# Patient Record
Sex: Male | Born: 1965 | Race: White | Hispanic: Yes | Marital: Married | Smoking: Current every day smoker
Health system: Southern US, Community
[De-identification: ages and names within clinical notes are randomized; demographics above are authoritative.]

## PROBLEM LIST (undated history)

## (undated) DIAGNOSIS — K5792 Diverticulitis of intestine, part unspecified, without perforation or abscess without bleeding: Secondary | ICD-10-CM

## (undated) DIAGNOSIS — M545 Low back pain, unspecified: Secondary | ICD-10-CM

## (undated) DIAGNOSIS — E7800 Pure hypercholesterolemia, unspecified: Secondary | ICD-10-CM

## (undated) DIAGNOSIS — I10 Essential (primary) hypertension: Secondary | ICD-10-CM

## (undated) DIAGNOSIS — G4733 Obstructive sleep apnea (adult) (pediatric): Secondary | ICD-10-CM

## (undated) DIAGNOSIS — I4891 Unspecified atrial fibrillation: Secondary | ICD-10-CM

## (undated) HISTORY — DX: Low back pain, unspecified: M54.50

## (undated) HISTORY — DX: Essential (primary) hypertension: I10

## (undated) HISTORY — PX: CARDIAC SURGERY: SHX584

## (undated) HISTORY — DX: Obstructive sleep apnea (adult) (pediatric): G47.33

## (undated) HISTORY — DX: Pure hypercholesterolemia, unspecified: E78.00

## (undated) HISTORY — DX: Diverticulitis of intestine, part unspecified, without perforation or abscess without bleeding: K57.92

## (undated) HISTORY — PX: LAPAROSCOPIC SIGMOID COLECTOMY: SHX5928

---

## 2000-12-02 ENCOUNTER — Encounter: Admission: RE | Admit: 2000-12-02 | Discharge: 2000-12-09 | Payer: Self-pay | Admitting: *Deleted

## 2003-03-31 ENCOUNTER — Encounter: Payer: Self-pay | Admitting: Family Medicine

## 2003-03-31 ENCOUNTER — Encounter: Admission: RE | Admit: 2003-03-31 | Discharge: 2003-03-31 | Payer: Self-pay | Admitting: Family Medicine

## 2006-09-29 ENCOUNTER — Emergency Department (HOSPITAL_COMMUNITY): Admission: EM | Admit: 2006-09-29 | Discharge: 2006-09-29 | Payer: Self-pay | Admitting: Emergency Medicine

## 2008-07-25 ENCOUNTER — Encounter: Admission: RE | Admit: 2008-07-25 | Discharge: 2008-07-25 | Payer: Self-pay | Admitting: Gastroenterology

## 2009-06-26 ENCOUNTER — Encounter: Admission: RE | Admit: 2009-06-26 | Discharge: 2009-06-26 | Payer: Self-pay | Admitting: Gastroenterology

## 2009-08-03 ENCOUNTER — Encounter (INDEPENDENT_AMBULATORY_CARE_PROVIDER_SITE_OTHER): Payer: Self-pay | Admitting: General Surgery

## 2009-08-03 ENCOUNTER — Inpatient Hospital Stay (HOSPITAL_COMMUNITY): Admission: RE | Admit: 2009-08-03 | Discharge: 2009-08-08 | Payer: Self-pay | Admitting: General Surgery

## 2011-01-30 LAB — BASIC METABOLIC PANEL
CO2: 28 mEq/L (ref 19–32)
CO2: 28 mEq/L (ref 19–32)
Calcium: 9.4 mg/dL (ref 8.4–10.5)
Chloride: 101 mEq/L (ref 96–112)
Chloride: 101 mEq/L (ref 96–112)
Creatinine, Ser: 0.77 mg/dL (ref 0.4–1.5)
GFR calc Af Amer: >60 mL/min (ref >60)
GFR calc Af Amer: >60 mL/min (ref >60)
Sodium: 135 mEq/L (ref 135–145)
Sodium: 137 mEq/L (ref 135–145)

## 2011-01-30 LAB — CBC
Hemoglobin: 14.9 g/dL (ref 13.0–17.0)
Hemoglobin: 16.4 g/dL (ref 13.0–17.0)
MCHC: 34.7 g/dL (ref 30.0–36.0)
MCHC: 35.2 g/dL (ref 30.0–36.0)
MCV: 92.7 fL (ref 78.0–100.0)
RBC: 4.55 MIL/uL (ref 4.22–5.81)
RBC: 5.09 MIL/uL (ref 4.22–5.81)
WBC: 17.6 10*3/uL — ABNORMAL HIGH (ref 4.0–10.5)
WBC: 8.9 10*3/uL (ref 4.0–10.5)

## 2011-01-30 LAB — CARDIAC PANEL(CRET KIN+CKTOT+MB+TROPI)
Relative Index: 0.2 (ref 0.0–2.5)
Total CK: 876 U/L — ABNORMAL HIGH (ref 7–232)

## 2011-01-31 LAB — CBC
HCT: 45.4 % (ref 39.0–52.0)
MCHC: 34.8 g/dL (ref 30.0–36.0)
MCV: 92.2 fL (ref 78.0–100.0)
RBC: 4.92 MIL/uL (ref 4.22–5.81)
WBC: 7.6 10*3/uL (ref 4.0–10.5)

## 2011-01-31 LAB — DIFFERENTIAL
Basophils Absolute: 0 10*3/uL (ref 0.0–0.1)
Eosinophils Relative: 3 % (ref 0–5)
Lymphocytes Relative: 40 % (ref 12–46)
Lymphs Abs: 3.1 10*3/uL (ref 0.7–4.0)
Neutro Abs: 3.6 10*3/uL (ref 1.7–7.7)
Neutrophils Relative %: 47 % (ref 43–77)

## 2011-01-31 LAB — COMPREHENSIVE METABOLIC PANEL
AST: 27 U/L (ref 0–37)
BUN: 16 mg/dL (ref 6–23)
CO2: 30 mEq/L (ref 19–32)
Calcium: 9.4 mg/dL (ref 8.4–10.5)
Chloride: 105 mEq/L (ref 96–112)
Creatinine, Ser: 0.94 mg/dL (ref 0.4–1.5)
GFR calc Af Amer: >60 mL/min (ref >60)
GFR calc non Af Amer: >60 mL/min (ref >60)
Glucose, Bld: 97 mg/dL (ref 70–99)
Total Bilirubin: 0.5 mg/dL (ref 0.3–1.2)

## 2014-02-08 ENCOUNTER — Ambulatory Visit (INDEPENDENT_AMBULATORY_CARE_PROVIDER_SITE_OTHER): Payer: Self-pay | Admitting: General Surgery

## 2014-02-23 ENCOUNTER — Encounter (INDEPENDENT_AMBULATORY_CARE_PROVIDER_SITE_OTHER): Payer: Self-pay | Admitting: General Surgery

## 2014-02-23 ENCOUNTER — Encounter (INDEPENDENT_AMBULATORY_CARE_PROVIDER_SITE_OTHER): Payer: Self-pay

## 2014-02-23 ENCOUNTER — Ambulatory Visit (INDEPENDENT_AMBULATORY_CARE_PROVIDER_SITE_OTHER): Payer: Managed Care, Other (non HMO) | Admitting: General Surgery

## 2014-02-23 VITALS — BP 143/103 | HR 77 | Temp 98.6°F | Resp 18 | Ht 66.0 in | Wt 186.4 lb

## 2014-02-23 DIAGNOSIS — R222 Localized swelling, mass and lump, trunk: Secondary | ICD-10-CM

## 2014-02-23 DIAGNOSIS — R229 Localized swelling, mass and lump, unspecified: Secondary | ICD-10-CM

## 2014-02-23 NOTE — Progress Notes (Signed)
Patient ID: Christopher [name], male   DOB: 07-21-1966, 48 y.o.   MRN: [id number]  Chief Complaint  Patient presents with  . Cyst    on back    HPI Christopher [name] is a 48 y.o. male.   HPI [age] yom I know from lap sigmoid colectomy for diveriticular disease presents with history Of a back mass in the superior portion that as a history of being drained. This is since recurred. This causes him some symptoms when he is sitting down as it bothers him. There is another area below this it is larger that is draining fairly frequently. He comes in today with his wife to discuss plans. Sounds like both of these have been infected at different times but currently are not so. Past Medical History  Diagnosis Date  . Hypertension     Past Surgical History  Procedure Laterality Date  . Laparoscopic sigmoid colectomy      History reviewed. No pertinent family history.  Social History History  Substance Use Topics  . Smoking status: Current Every Day Smoker -- 1.00 packs/day    Types: Cigarettes  . Smokeless tobacco: Not on file  . Alcohol Use: Yes    No Known Allergies  Current Outpatient Prescriptions  Medication Sig Dispense Refill  . aspirin 81 MG tablet Take 81 mg by mouth daily.       No current facility-administered medications for this visit.    Review of Systems Review of Systems  Constitutional: Negative for fever, chills and unexpected weight change.  HENT: Negative for congestion, hearing loss, sore throat, trouble swallowing and voice change.   Eyes: Negative for visual disturbance.  Respiratory: Positive for wheezing. Negative for cough.   Cardiovascular: Negative for chest pain, palpitations and leg swelling.  Gastrointestinal: Negative for nausea, vomiting, abdominal pain, diarrhea, constipation, blood in stool, abdominal distention, anal bleeding and rectal pain.  Genitourinary: Negative for hematuria and difficulty urinating.  Musculoskeletal: Negative for arthralgias.    Skin: Negative for rash and wound.  Neurological: Negative for seizures, syncope, weakness and headaches.  Hematological: Negative for adenopathy. Does not bruise/bleed easily.  Psychiatric/Behavioral: Negative for confusion.    Blood pressure 143/103, pulse 77, temperature 98.6 F (37 C), temperature source Temporal, resp. rate 18, height 5\' 6"  (1.676 m), weight 186 lb 6.4 oz (84.55 kg).  Physical Exam Physical Exam  Constitutional: He appears well-developed and well-nourished.  Cardiovascular: Normal rate.   Pulmonary/Chest: Effort normal.        Assessment    Back mass x 2, likely sebaceous cyst    Plan    I recommended excising both of these do to the fact that one of the mass and history of infection and the other one is currently draining. I recommended doing this time the next few weeks. He is going to need to get his blood pressure under better control and he is going to contact Dr Foy GuadalajaraFried. We discussed the risks including recurrence and infection as well as possibly an open wound. They will call and schedule when ready       Emelia LoronMatthew Amayrany Cafaro 02/23/2014, 3:27 PM

## 2014-04-25 ENCOUNTER — Encounter (INDEPENDENT_AMBULATORY_CARE_PROVIDER_SITE_OTHER): Payer: Managed Care, Other (non HMO) | Admitting: General Surgery

## 2016-08-04 ENCOUNTER — Ambulatory Visit: Payer: Self-pay | Admitting: Cardiovascular Disease

## 2016-08-20 ENCOUNTER — Ambulatory Visit: Payer: Self-pay | Admitting: Interventional Cardiology

## 2016-08-20 ENCOUNTER — Encounter (INDEPENDENT_AMBULATORY_CARE_PROVIDER_SITE_OTHER): Payer: Self-pay

## 2016-08-20 ENCOUNTER — Ambulatory Visit: Payer: Self-pay | Admitting: Cardiology

## 2016-08-20 ENCOUNTER — Ambulatory Visit (INDEPENDENT_AMBULATORY_CARE_PROVIDER_SITE_OTHER): Payer: No Typology Code available for payment source | Admitting: Interventional Cardiology

## 2016-08-20 ENCOUNTER — Encounter: Payer: Self-pay | Admitting: Interventional Cardiology

## 2016-08-20 VITALS — BP 130/100 | HR 69 | Ht 66.0 in | Wt 185.0 lb

## 2016-08-20 DIAGNOSIS — Z720 Tobacco use: Secondary | ICD-10-CM | POA: Diagnosis not present

## 2016-08-20 DIAGNOSIS — I10 Essential (primary) hypertension: Secondary | ICD-10-CM

## 2016-08-20 DIAGNOSIS — Z136 Encounter for screening for cardiovascular disorders: Secondary | ICD-10-CM | POA: Diagnosis not present

## 2016-08-20 DIAGNOSIS — R0789 Other chest pain: Secondary | ICD-10-CM | POA: Diagnosis not present

## 2016-08-20 MED ORDER — AMLODIPINE BESYLATE 5 MG PO TABS: 5.0000 mg | ORAL_TABLET | Freq: Every day | ORAL | 6 refills | Status: DC

## 2016-08-20 NOTE — Progress Notes (Signed)
Cardiology Office Note   Date:  08/20/2016   ID:  Christopher [name], DOB 27-Jun-1966, MRN [id number]  PCP:  Astrid Divine, MD    No chief complaint on file.  HTN  Wt Readings from Last 3 Encounters:  08/20/16 83.9 kg (185 lb)  02/23/14 84.6 kg (186 lb 6.4 oz)       History of Present Illness: Christopher [name] is a 50 y.o. male  Who has had HTN for a few years.  He was started on meds 2-3 years ago.  He was lisinopril initially for a few weeks.  He stopped after a few weeks and his BP was ok.  He went back after a year, and it was restarted but he had side effects.  He felt poorly with the lisinopril.  He had headaches and arm pain as well.  Metoprolol a month ago.  It was better tolerated.  He has BP readings of 130-150/80-100.  Previously, diastolics were 110-115.    Two weeks ago, he had an episode of chest pain lasting a few minutes that resolved.  He was pale.  No other problems since then.  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    Past Medical History:  Diagnosis Date  . Hypertension     Past Surgical History:  Procedure Laterality Date  . LAPAROSCOPIC SIGMOID COLECTOMY       Current Outpatient Prescriptions  Medication Sig Dispense Refill  . aspirin 81 MG tablet Take 81 mg by mouth daily.    . metoprolol succinate (TOPROL-XL) 50 MG 24 hr tablet Take 50 mg by mouth daily. Take with or immediately following a meal.    . amLODipine (NORVASC) 5 MG tablet Take 1 tablet (5 mg total) by mouth daily. 30 tablet 6   No current facility-administered medications for this visit.     Allergies:   Review of patient's allergies indicates no known allergies.    Social History:  The patient  reports that he has been smoking Cigarettes.  He has been smoking about 1.00 pack per day. He does not have any smokeless tobacco history on file. He reports that he drinks alcohol. He reports that he does not use drugs.   Family History:  The patient's  family history is not on file.    ROS:  Please see the history of present illness.   Otherwise, review of systems are positive for episode of chest pain.   All other systems are reviewed and negative.    PHYSICAL EXAM: VS:  BP (!) 130/100   Pulse 69   Ht 5\' 6"  (1.676 m)   Wt 83.9 kg (185 lb)   BMI 29.86 kg/m  , BMI Body mass index is 29.86 kg/m. GEN: Well nourished, well developed, in no acute distress  HEENT: normal  Neck: no JVD, carotid bruits, or masses Cardiac: RRR; no murmurs, rubs, or gallops,no edema  Respiratory:  clear to auscultation bilaterally, normal work of breathing GI: soft, nontender, nondistended, + BS MS: no deformity or atrophy  Skin: warm and dry, no rash Neuro:  Strength and sensation are intact Psych: euthymic mood, full affect   EKG:   The ekg ordered today demonstrates NSR, no ST segment changes   Recent Labs: No results found for requested labs within last 8760 hours.   Lipid Panel No results found for: CHOL, TRIG, HDL, CHOLHDL, VLDL, LDLCALC, LDLDIRECT   Other studies Reviewed: Additional studies/ records that were reviewed today with results  demonstrating: rhythm strips.   ASSESSMENT AND PLAN:  1. HTN: Add amlodipine 5 mg daily.  Continue metoprolol.  Prior ECG showed some PACs.  It was read as AFib, but there are clear P waves on the tracing.  COntinue to montior.  Sx are better with the metoprolol 2. Chest discomfort: Plan for ETT once BP is better.  I suspect his episode was more of a vagal reaction. He has no exertional chest discomfort. His job is very physically stressful. 3. Tobacco abuse: He needs to stop smoking.     Current medicines are reviewed at length with the patient today.  The patient concerns regarding his medicines were addressed.  The following changes have been made:  Add amlodipine  Labs/ tests ordered today include:   Orders Placed This Encounter  Procedures  . EKG 12-Lead    Recommend 150 minutes/week of  aerobic exercise Low fat, low carb, high fiber diet recommended  Disposition:   FU in for stress test in a few weeks when BP better   Signed, Lance MussJayadeep Treyshawn Muldrew, MD  08/20/2016 4:55 PM    [facility] [address], [place], [place]  [address] Phone: [phone]; Fax: [phone]

## 2016-08-20 NOTE — Patient Instructions (Signed)
Medication Instructions:  Start taking Amlodipine 5 mg daily. All other medications remain the same.  Labwork: None  Testing/Procedures: None  Follow-Up: Will be determined  Any Other Special Instructions Will Be Listed Below (If Applicable). Check BP daily and call Larita FifeLynn (Dr Hoyle BarrVaranasi's nurse) at [phone] in 2 weeks to report readings.    If you need a refill on your cardiac medications before your next appointment, please call your pharmacy.

## 2016-09-25 ENCOUNTER — Telehealth: Payer: Self-pay | Admitting: Interventional Cardiology

## 2016-09-25 DIAGNOSIS — R0789 Other chest pain: Secondary | ICD-10-CM

## 2016-09-25 NOTE — Telephone Encounter (Signed)
Follow Up:    Pt was told to call back after a month with some blood pressure readings.

## 2016-09-25 NOTE — Telephone Encounter (Signed)
Voice mail box not set up yet. Unable to leave message.

## 2016-09-26 NOTE — Telephone Encounter (Signed)
I spoke with pt's wife who reports the following BP readings- 11/23-134/90 11/22-130/98 11/19-134/82 11/18-113/72 11/14-133/87 11/8-146/86 11/4-130/88 Did not record heart rate.  Pt works nights and takes medications in the evening around dinner time.  BP readings were all taken in the evening just prior to taking his medications.  Wife also reports pt continues to have episodes of chest pain, left arm pain and headache.  Five to six episodes since office visit with Dr. Eldridge DaceVaranasi.

## 2016-09-26 NOTE — Telephone Encounter (Signed)
I returned wife's call, Christopher [name] (on DPR). LMTCB on [phone]

## 2016-09-26 NOTE — Telephone Encounter (Signed)
Pt wife is returning call can callback [phone]

## 2016-09-27 NOTE — Telephone Encounter (Signed)
Increase amlodipine to 10 mg daily

## 2016-09-29 MED ORDER — AMLODIPINE BESYLATE 10 MG PO TABS: 10.0000 mg | ORAL_TABLET | Freq: Every day | ORAL | 3 refills | Status: DC

## 2016-09-29 NOTE — Telephone Encounter (Signed)
OK for ETT

## 2016-09-29 NOTE — Telephone Encounter (Signed)
Christopher [name] is returning your call . Please call at [phone]

## 2016-09-29 NOTE — Telephone Encounter (Signed)
**Note De-identified Lundon Verdejo Obfuscation** LMTCB

## 2016-09-29 NOTE — Telephone Encounter (Signed)
The pts wife, Christopher [name], is advised and she verbalized understanding. Per her request I have sent increased Amlodipine RX to [facility] to fill.  [name] states that they will continue to monitor he pts BP and call me back in 1-2 weeks to report his readings.  She wants to know if Dr Eldridge DaceVaranasi is ready to order the pts stress test or if he wants to wait until the pts BP is better?   Please advise.

## 2016-09-30 NOTE — Telephone Encounter (Signed)
**Note De-identified Christopher [name] Obfuscation** LMTCB

## 2016-09-30 NOTE — Telephone Encounter (Signed)
**Note De-Identified Taiyo Kozma Obfuscation** The pts wife, Efraim KaufmannMelissa, is advised that Dr Eldridge DaceVaranasi has ordered an ETT for the pt. I went over ETT instructions with [name] over the phone and answered all of her questions. She verbalized understanding and stated that she wrote instructions down and will advise the pt. She is aware that someone from the office will call to arrange a date and time for test.

## 2016-09-30 NOTE — Telephone Encounter (Signed)
F/u  Patient's wife is returning a call from [place].

## 2016-10-08 ENCOUNTER — Encounter: Payer: Self-pay | Admitting: Interventional Cardiology

## 2016-10-15 ENCOUNTER — Other Ambulatory Visit: Payer: Self-pay

## 2016-10-15 ENCOUNTER — Ambulatory Visit: Payer: No Typology Code available for payment source | Admitting: Interventional Cardiology

## 2016-10-15 ENCOUNTER — Ambulatory Visit: Payer: No Typology Code available for payment source

## 2016-10-15 DIAGNOSIS — R5383 Other fatigue: Secondary | ICD-10-CM

## 2016-10-15 DIAGNOSIS — I4891 Unspecified atrial fibrillation: Secondary | ICD-10-CM | POA: Insufficient documentation

## 2016-10-15 DIAGNOSIS — I480 Paroxysmal atrial fibrillation: Secondary | ICD-10-CM

## 2016-10-15 LAB — BASIC METABOLIC PANEL WITH GFR
BUN: 25 mg/dL (ref 7–25)
CO2: 23 mmol/L (ref 20–31)
Calcium: 9.3 mg/dL (ref 8.6–10.3)
Chloride: 105 mmol/L (ref 98–110)
Creat: 1.01 mg/dL (ref 0.70–1.33)
Glucose, Bld: 89 mg/dL (ref 65–99)
Potassium: 4 mmol/L (ref 3.5–5.3)
Sodium: 139 mmol/L (ref 135–146)

## 2016-10-15 LAB — TSH: TSH: 1.38 m[IU]/L (ref 0.40–4.50)

## 2016-10-15 NOTE — Progress Notes (Unsigned)
Cardiology Office Note   Date:  10/15/2016   ID:  Christopher [name] Doebler, DOB 09/15/1966, MRN [id number]  PCP:  Astrid DivineGRIFFIN,ELAINE COLLINS, MD    No chief complaint on file. HTN, AFib   Wt Readings from Last 3 Encounters:  08/20/16 185 lb (83.9 kg)  02/23/14 186 lb 6.4 oz (84.6 kg)       History of Present Illness: Christopher [name] Kelly is a 50 y.o. male  Who had HTN and chest pain.  He was scheduled for ETT today.  He noticed palpitations this AM which are different than his prior sx.  He skipped his Toprol this morning.  ECG revealed AFib with a rate of 103.    No chest pain since BP meds have been titrated. He continues to take aspirin.  No bleeding problems.      Past Medical History:  Diagnosis Date  . Hypertension     Past Surgical History:  Procedure Laterality Date  . LAPAROSCOPIC SIGMOID COLECTOMY       Current Outpatient Prescriptions  Medication Sig Dispense Refill  . amLODipine (NORVASC) 10 MG tablet Take 1 tablet (10 mg total) by mouth daily. 30 tablet 3  . aspirin 81 MG tablet Take 81 mg by mouth daily.    . metoprolol succinate (TOPROL-XL) 50 MG 24 hr tablet Take 50 mg by mouth daily. Take with or immediately following a meal.     No current facility-administered medications for this visit.     Allergies:   Patient has no known allergies.    Social History:  The patient  reports that he has been smoking Cigarettes.  He has been smoking about 1.00 pack per day. He has never used smokeless tobacco. He reports that he drinks alcohol. He reports that he does not use drugs.   Family History:  The patient's ***family history is not on file.    ROS:  Please see the history of present illness.   Otherwise, review of systems are positive for ***.   All other systems are reviewed and negative.    PHYSICAL EXAM: VS:  There were no vitals taken for this visit. , BMI There is no height or weight on file to calculate BMI. GEN: Well nourished, well developed, in no acute  distress  HEENT: normal  Neck: no JVD, carotid bruits, or masses Cardiac: irregularly irregular; no murmurs, rubs, or gallops,no edema  Respiratory:  clear to auscultation bilaterally, normal work of breathing GI: soft, nontender, nondistended, + BS MS: no deformity or atrophy  Skin: warm and dry, no rash Neuro:  Strength and sensation are intact Psych: euthymic mood, full affect   EKG:   The ekg ordered today demonstrates AFib, borderline rate control   Recent Labs: 10/15/2016: BUN 25; Creat 1.01; Potassium 4.0; Sodium 139   Lipid Panel No results found for: CHOL, TRIG, HDL, CHOLHDL, VLDL, LDLCALC, LDLDIRECT   Other studies Reviewed: Additional studies/ records that were reviewed today with results demonstrating: prior notes reviewed.   ASSESSMENT AND PLAN:  1. AFib:  New onset AFib.,  Likely started this AM.  He feels palpitations.  No CP or SHOB.  No ETT today due to rhythm change.   Restart Toprol.  Continue aspirin.  2. HTN: Much better controlled.  117/44 today.  Continue current meds.  3. Chest pain: No severe chest pain since his BP meds have been titrated.  WIll check echo due to the AFib.  CHeck TSH as well.     This patients CHA2DS2-VASc  Score and unadjusted Ischemic Stroke Rate (% per year) is equal to 0.6 % stroke rate/year from a score of 1  Above score calculated as 1 point each if present [CHF, HTN, DM, Vascular=MI/PAD/Aortic Plaque, Age if 65-74, or Male] Above score calculated as 2 points each if present [Age > 75, or Stroke/TIA/TE]  \   Current medicines are reviewed at length with the patient today.  The patient concerns regarding his medicines were addressed.  The following changes have been made:  No change  Labs/ tests ordered today include:  No orders of the defined types were placed in this encounter.   Recommend 150 minutes/week of aerobic exercise Low fat, low carb, high fiber diet recommended  Disposition:   FU in a few days by phone;  I think he will be able to tell if his rhythm changes back to normal   Signed, Lance MussJayadeep Jatavian Calica, MD  10/15/2016 5:38 PM    [facility] [address], [place], [place]  [address] Phone: [phone]; Fax: [phone]

## 2016-10-16 ENCOUNTER — Telehealth: Payer: Self-pay | Admitting: *Deleted

## 2016-10-16 NOTE — Telephone Encounter (Signed)
-----   Message from Corky CraftsJayadeep S Varanasi, MD sent at 10/16/2016  4:12 PM EST ----- Electrolytes, TSH are normal.  Has pulse come back to normal?

## 2016-10-17 NOTE — Telephone Encounter (Signed)
Patient calling back to get test results. Thanks.

## 2016-10-17 NOTE — Telephone Encounter (Signed)
Spoke with Efraim Kaufmannmelissa, aware of lab results. She reports the patient is stable. They plan on calling after the first of the year with bp and pulse readings for dr Eldridge Dacevaranasi

## 2016-11-03 ENCOUNTER — Ambulatory Visit (HOSPITAL_COMMUNITY): Payer: No Typology Code available for payment source | Attending: Cardiology

## 2016-11-03 ENCOUNTER — Other Ambulatory Visit: Payer: Self-pay

## 2016-11-03 DIAGNOSIS — I480 Paroxysmal atrial fibrillation: Secondary | ICD-10-CM | POA: Diagnosis present

## 2016-11-03 DIAGNOSIS — I517 Cardiomegaly: Secondary | ICD-10-CM | POA: Insufficient documentation

## 2016-11-18 ENCOUNTER — Telehealth: Payer: Self-pay | Admitting: Interventional Cardiology

## 2016-11-18 DIAGNOSIS — R079 Chest pain, unspecified: Secondary | ICD-10-CM

## 2016-11-18 NOTE — Telephone Encounter (Signed)
Spoke with patient's wife (DPR on file). She states that the patient has had 6 different episodes of chest pain over the past week. She states that the patient has not had any episodes since Sunday morning. She states that the patient is at rest when the chest pain occurs and that he has pain in his arms and he has a headache as well. She states that he just waits a few minutes and the pain goes away. She states that he does not have any other symptoms during the episodes. She states that he is taking the amlodipine and metoprolol as prescribed and his BPs have been fine. His last BP was 118/80 and HR 72. He has not felt any fluttering or palpitations in his chest. She states that the patient had a normal echo on 11/03/16 and that she was advised to call back and let Dr. Eldridge DaceVaranasi know if her husband was having any further chest pain. Routing message to Dr. Eldridge DaceVaranasi for review.

## 2016-11-18 NOTE — Telephone Encounter (Signed)
OK to schedule exercise treadmill test for patient.  He should stay on his beta blocker for the test.

## 2016-11-18 NOTE — Telephone Encounter (Signed)
New Message      Pt wife states that Dr Eldridge DaceVaranasi said to call back if pt is still having chest pains after he had the Echo 11/03/16 , echo came back normal but he had 6 episodes lasting 3 minutes of really strong pain

## 2016-11-19 DIAGNOSIS — R079 Chest pain, unspecified: Secondary | ICD-10-CM | POA: Insufficient documentation

## 2016-11-19 NOTE — Telephone Encounter (Signed)
Follow Up   Pt returning phone call from [name]

## 2016-11-19 NOTE — Telephone Encounter (Signed)
Patient's wife called and informed that Dr. Eldridge DaceVaranasi wants patient to have an exercise treadmill test. Order was placed. Wife was given instructions for the test. She was instructed to let the patient know to stay on his beta blocker for the test. The wife states that she should be the one called to schedule the test since her husband works at night and sleeps during the day. Her number is [phone].  She was told that she would receive a call about scheduling this test. She verbalized understanding and was in agreement with this plan.b

## 2016-12-05 ENCOUNTER — Ambulatory Visit (INDEPENDENT_AMBULATORY_CARE_PROVIDER_SITE_OTHER): Payer: No Typology Code available for payment source

## 2016-12-05 DIAGNOSIS — R079 Chest pain, unspecified: Secondary | ICD-10-CM

## 2016-12-05 LAB — EXERCISE TOLERANCE TEST
CHL CUP STRESS STAGE 1 GRADE: 0 %
CHL CUP STRESS STAGE 1 HR: 63 {beats}/min
CHL CUP STRESS STAGE 1 SBP: 115 mmHg
CHL CUP STRESS STAGE 1 SPEED: 0 mph
CHL CUP STRESS STAGE 2 GRADE: 0 %
CHL CUP STRESS STAGE 2 HR: 71 {beats}/min
CHL CUP STRESS STAGE 4 GRADE: 10 %
CHL CUP STRESS STAGE 4 HR: 105 {beats}/min
CHL CUP STRESS STAGE 5 DBP: 103 mmHg
CHL CUP STRESS STAGE 5 GRADE: 12 %
CHL CUP STRESS STAGE 5 SPEED: 2.5 mph
CHL CUP STRESS STAGE 6 GRADE: 14 %
CHL CUP STRESS STAGE 6 HR: 118 {beats}/min
CHL CUP STRESS STAGE 6 SBP: 174 mmHg
CHL CUP STRESS STAGE 7 GRADE: 14 %
CHL CUP STRESS STAGE 7 HR: 118 {beats}/min
CHL CUP STRESS STAGE 7 SPEED: 3.4 mph
CHL CUP STRESS STAGE 8 DBP: 100 mmHg
CHL CUP STRESS STAGE 8 GRADE: 0 %
CHL CUP STRESS STAGE 9 GRADE: 0 %
CHL CUP STRESS STAGE 9 SPEED: 0 mph
Estimated workload: 10.1 METS
Exercise duration (min): 9 min
Exercise duration (sec): 0 s
MPHR: 170 {beats}/min
Peak HR: 118 {beats}/min
Percent HR: 71 %
Percent of predicted max HR: 69 %
RPE: 15
Rest HR: 64 {beats}/min
Stage 1 DBP: 71 mmHg
Stage 2 Speed: 0 mph
Stage 3 Grade: 0.1 %
Stage 3 HR: 71 {beats}/min
Stage 3 Speed: 0 mph
Stage 4 DBP: 108 mmHg
Stage 4 SBP: 157 mmHg
Stage 4 Speed: 1.7 mph
Stage 5 HR: 114 {beats}/min
Stage 5 SBP: 158 mmHg
Stage 6 DBP: 102 mmHg
Stage 6 Speed: 3.4 mph
Stage 8 HR: 101 {beats}/min
Stage 8 SBP: 165 mmHg
Stage 8 Speed: 0 mph
Stage 9 DBP: 103 mmHg
Stage 9 HR: 82 {beats}/min
Stage 9 SBP: 137 mmHg

## 2016-12-09 ENCOUNTER — Telehealth: Payer: Self-pay | Admitting: Interventional Cardiology

## 2016-12-09 NOTE — Telephone Encounter (Signed)
New massage   Pt wife verbalized that she is returning call for rn for husband   For 12-05-2016 results

## 2016-12-09 NOTE — Telephone Encounter (Signed)
Informed wife of results.  DPR on file.  Wife verbalized understanding.

## 2017-02-04 ENCOUNTER — Other Ambulatory Visit: Payer: Self-pay | Admitting: Interventional Cardiology

## 2017-04-20 ENCOUNTER — Telehealth: Payer: Self-pay | Admitting: Interventional Cardiology

## 2017-04-20 NOTE — Telephone Encounter (Signed)
°  New Prob  Was evaluated for swelling and redness to ankles bilaterally x 2 days on 6/25 at [facility]. At that time, pt was advised to stop amLODipine (NORVASC) 10 MG tablet as symptoms may have been related to medication. Requesting to speak to nurse regarding this.

## 2017-04-21 NOTE — Telephone Encounter (Signed)
Left message for patient to call back  

## 2017-04-22 NOTE — Telephone Encounter (Signed)
Returned call to patient's wife (DPR on file). Wife states that the patient has had swelling and redness in his feet and ankles. She states that she took the patient to be [facility] thinking it was cellulitis. She states that they said it was not cellulitis and that it was most likely related to the amlodipine and advised him to stop taking it. Bps on amlodipine 10 mg QD were 1 teens/70s Patient stopped taking it Sunday night. Wife wanted to make MD aware. Info forwarded for review and recommendation.

## 2017-04-22 NOTE — Telephone Encounter (Signed)
New Message ° ° pt wife verbalized that she is returning call for rn °

## 2017-04-24 NOTE — Telephone Encounter (Signed)
Follow up ° ° ° °Pt wife is calling to follow up on this. Please call.  °

## 2017-04-24 NOTE — Telephone Encounter (Signed)
Would start lisinopril 10 mg daily if his blood pressure increases.  He would need a BMet 1 week after starting this medicine.

## 2017-04-24 NOTE — Telephone Encounter (Signed)
Spoke with patient's wife and advised her to monitor his BP for now and call us back if starts to increase. I made her aware that if his BP rises we will start him on lisinopril 10 mg daily and check a BMET 1 week after. Wife verbalized understanding and is in agreement with this plan.

## 2018-05-07 ENCOUNTER — Other Ambulatory Visit: Payer: Self-pay | Admitting: Interventional Cardiology

## 2018-10-18 ENCOUNTER — Emergency Department (HOSPITAL_COMMUNITY): Payer: No Typology Code available for payment source

## 2018-10-18 ENCOUNTER — Other Ambulatory Visit: Payer: Self-pay

## 2018-10-18 ENCOUNTER — Emergency Department (HOSPITAL_COMMUNITY)
Admission: EM | Admit: 2018-10-18 | Discharge: 2018-10-18 | Disposition: A | Discharge status: Home / Self Care | Payer: No Typology Code available for payment source | Attending: Emergency Medicine | Admitting: Emergency Medicine

## 2018-10-18 ENCOUNTER — Encounter (HOSPITAL_COMMUNITY): Payer: Self-pay

## 2018-10-18 DIAGNOSIS — M5441 Lumbago with sciatica, right side: Secondary | ICD-10-CM | POA: Insufficient documentation

## 2018-10-18 DIAGNOSIS — Z79899 Other long term (current) drug therapy: Secondary | ICD-10-CM | POA: Insufficient documentation

## 2018-10-18 DIAGNOSIS — M5440 Lumbago with sciatica, unspecified side: Secondary | ICD-10-CM

## 2018-10-18 DIAGNOSIS — M545 Low back pain: Secondary | ICD-10-CM | POA: Diagnosis present

## 2018-10-18 DIAGNOSIS — I4891 Unspecified atrial fibrillation: Secondary | ICD-10-CM | POA: Insufficient documentation

## 2018-10-18 DIAGNOSIS — F17210 Nicotine dependence, cigarettes, uncomplicated: Secondary | ICD-10-CM | POA: Diagnosis not present

## 2018-10-18 DIAGNOSIS — Z7982 Long term (current) use of aspirin: Secondary | ICD-10-CM | POA: Diagnosis not present

## 2018-10-18 DIAGNOSIS — I10 Essential (primary) hypertension: Secondary | ICD-10-CM | POA: Diagnosis not present

## 2018-10-18 MED ORDER — ONDANSETRON HCL 4 MG/2ML IJ SOLN
INTRAMUSCULAR | Status: AC
  Filled 2018-10-18: qty 2

## 2018-10-18 MED ORDER — LORAZEPAM 2 MG/ML IJ SOLN
0.5000 mg | Freq: Once | INTRAMUSCULAR | Status: AC
  Administered 2018-10-18: 0.5 mg via INTRAVENOUS
  Filled 2018-10-18: qty 1

## 2018-10-18 MED ORDER — PREDNISONE 10 MG PO TABS: 20.0000 mg | ORAL_TABLET | Freq: Every day | ORAL | 0 refills | Status: DC

## 2018-10-18 MED ORDER — ONDANSETRON HCL 4 MG/2ML IJ SOLN
4.0000 mg | Freq: Once | INTRAMUSCULAR | Status: AC
  Administered 2018-10-18: 4 mg via INTRAVENOUS

## 2018-10-18 MED ORDER — TRAMADOL HCL 50 MG PO TABS: 50.0000 mg | ORAL_TABLET | Freq: Four times a day (QID) | ORAL | 0 refills | Status: DC | PRN

## 2018-10-18 MED ORDER — HYDROMORPHONE HCL 1 MG/ML IJ SOLN
1.0000 mg | Freq: Once | INTRAMUSCULAR | Status: AC
  Administered 2018-10-18: 1 mg via INTRAVENOUS
  Filled 2018-10-18: qty 1

## 2018-10-18 MED ORDER — METHYLPREDNISOLONE SODIUM SUCC 125 MG IJ SOLR
125.0000 mg | Freq: Once | INTRAMUSCULAR | Status: AC
  Administered 2018-10-18: 125 mg via INTRAVENOUS
  Filled 2018-10-18: qty 2

## 2018-10-18 NOTE — ED Triage Notes (Signed)
Pt hurt his back three days ago. Was in the car when his back "popped" in the car and then got worse when he started walking up the steps.  Family unable to get him to the car to take him to the hospital.  EMS gave 50 mcg fent. Given in route.

## 2018-10-18 NOTE — ED Provider Notes (Signed)
[facility] EMERGENCY DEPARTMENT Provider Note   CSN: [id number] Arrival date & time: 10/18/18  2049     History   Chief Complaint Chief Complaint  Patient presents with  . Back Pain    HPI Christopher [name] is a 52 y.o. male.  Patient complains of severe lower back pain radiating down his right leg  The history is provided by the patient. No language interpreter was used.  Back Pain   This is a new problem. The current episode started 12 to 24 hours ago. The problem occurs constantly. The problem has not changed since onset.The pain is associated with no known injury. The pain is present in the lumbar spine. The quality of the pain is described as shooting. The pain radiates to the right knee. The pain is at a severity of 6/10. The pain is moderate. Pertinent negatives include no chest pain, no headaches and no abdominal pain.    Past Medical History:  Diagnosis Date  . Hypertension     Patient Active Problem List   Diagnosis Date Noted  . Chest pain 11/19/2016  . Atrial fibrillation (HCC) 10/15/2016  . Essential hypertension 08/20/2016    Past Surgical History:  Procedure Laterality Date  . LAPAROSCOPIC SIGMOID COLECTOMY          Home Medications    Prior to Admission medications   Medication Sig Start Date End Date Taking? Authorizing Provider  amLODipine (NORVASC) 10 MG tablet Take 10 mg by mouth every evening.   Yes [provider]  aspirin 81 MG tablet Take 81 mg by mouth every evening.    Yes [provider]  atorvastatin (LIPITOR) 20 MG tablet Take 20 mg by mouth every evening.   Yes [provider]  metoprolol succinate (TOPROL-XL) 50 MG 24 hr tablet Take 50 mg by mouth every evening.    Yes [provider]  predniSONE (DELTASONE) 10 MG tablet Take 2 tablets (20 mg total) by mouth daily. 10/18/18   Bethann BerkshireZammit, Prosperity Darrough, MD  traMADol (ULTRAM) 50 MG tablet Take 1 tablet (50 mg total) by mouth every 6 (six) hours as needed.  10/18/18   Bethann BerkshireZammit, Bonniejean Piano, MD    Family History Family History  Problem Relation Age of Onset  . Heart attack Neg Hx     Social History Social History   Tobacco Use  . Smoking status: Current Every Day Smoker    Packs/day: 1.00    Types: Cigarettes  . Smokeless tobacco: Never Used  Substance Use Topics  . Alcohol use: Yes  . Drug use: No     Allergies   Patient has no known allergies.   Review of Systems Review of Systems  Constitutional: Negative for appetite change and fatigue.  HENT: Negative for congestion, ear discharge and sinus pressure.   Eyes: Negative for discharge.  Respiratory: Negative for cough.   Cardiovascular: Negative for chest pain.  Gastrointestinal: Negative for abdominal pain and diarrhea.  Genitourinary: Negative for frequency and hematuria.  Musculoskeletal: Positive for back pain.  Skin: Negative for rash.  Neurological: Negative for seizures and headaches.  Psychiatric/Behavioral: Negative for hallucinations.     Physical Exam Updated Vital Signs BP 106/74   Pulse 71   Temp 98 F (36.7 C) (Oral)   Resp 16   Ht 5\' 6"  (1.676 m)   Wt 81.6 kg   SpO2 98%   BMI 29.05 kg/m   Physical Exam Constitutional:      Appearance: He is well-developed.  HENT:  Head: Normocephalic.     Nose: Nose normal.  Eyes:     General: No scleral icterus.    Conjunctiva/sclera: Conjunctivae normal.  Neck:     Musculoskeletal: Neck supple.     Thyroid: No thyromegaly.  Cardiovascular:     Rate and Rhythm: Normal rate and regular rhythm.     Heart sounds: No murmur. No friction rub. No gallop.   Pulmonary:     Breath sounds: No stridor. No wheezing or rales.  Chest:     Chest wall: No tenderness.  Abdominal:     General: There is no distension.     Tenderness: There is no abdominal tenderness. There is no rebound.  Musculoskeletal: Normal range of motion.     Comments: Tender lumbar spine  Lymphadenopathy:     Cervical: No cervical  adenopathy.  Skin:    Findings: No erythema or rash.  Neurological:     Mental Status: He is alert and oriented to person, place, and time.     Motor: No abnormal muscle tone.     Coordination: Coordination normal.     Comments: Positive straight leg raise on the right no decrease strength or sensation  Psychiatric:        Behavior: Behavior normal.      ED Treatments / Results  Labs (all labs ordered are listed, but only abnormal results are displayed) Labs Reviewed - No data to display  EKG None  Radiology Dg Lumbar Spine Complete  Result Date: 10/18/2018 CLINICAL DATA:  52 year old male with 5 days of low back pain after un-hooking trailer. EXAM: LUMBAR SPINE - COMPLETE 4+ VIEW COMPARISON:  CT Abdomen and Pelvis 06/26/2009. FINDINGS: Normal lumbar segmentation. Chronic straightening of lordosis. Stable disc spaces since 2010. Chronic but increased anterior degenerative endplate spurring at L1-L2. No pars fracture. Visible lower thoracic levels appear intact with chronic endplate spurring. Sacral ala and SI joints appear negative. No acute osseous abnormality identified. Negative visible bowel gas pattern. There is a staple line in the central pelvis. IMPRESSION: 1. No acute osseous abnormality identified in the lumbar spine. 2. Chronic endplate degeneration at L1-L2 with relatively preserved disc spaces. Electronically Signed   By: Odessa Fleming M.D.   On: 10/18/2018 21:48    Procedures Procedures (including critical care time)  Medications Ordered in ED Medications  HYDROmorphone (DILAUDID) injection 1 mg (1 mg Intravenous Given 10/18/18 2107)  LORazepam (ATIVAN) injection 0.5 mg (0.5 mg Intravenous Given 10/18/18 2107)  methylPREDNISolone sodium succinate (SOLU-MEDROL) 125 mg/2 mL injection 125 mg (125 mg Intravenous Given 10/18/18 2107)  ondansetron (ZOFRAN) injection 4 mg ( Intravenous Canceled Entry 10/18/18 2121)     Initial Impression / Assessment and Plan / ED Course  I  have reviewed the triage vital signs and the nursing notes.  Pertinent labs & imaging results that were available during my care of the patient were reviewed by me and considered in my medical decision making (see chart for details).     Patient with back pain and sciatica.  Patient improved with pain medicine and steroids.  He is sent home with steroids and Ultram.  He will follow-up with his doctor  Final Clinical Impressions(s) / ED Diagnoses   Final diagnoses:  Acute right-sided low back pain with sciatica, sciatica laterality unspecified    ED Discharge Orders         Ordered    predniSONE (DELTASONE) 10 MG tablet  Daily     10/18/18 2248    traMADol (ULTRAM)  50 MG tablet  Every 6 hours PRN     10/18/18 2248           Bethann BerkshireZammit, Blaize Nipper, MD 10/18/18 2251

## 2018-10-18 NOTE — ED Notes (Signed)
Returned from xr

## 2018-10-18 NOTE — Discharge Instructions (Addendum)
Follow-up with your doctor next week for recheck no heavy lifting

## 2019-06-03 ENCOUNTER — Other Ambulatory Visit: Payer: Self-pay | Admitting: Family Medicine

## 2019-06-03 ENCOUNTER — Ambulatory Visit
Admission: RE | Admit: 2019-06-03 | Discharge: 2019-06-03 | Disposition: A | Discharge status: Home / Self Care | Payer: No Typology Code available for payment source | Source: Ambulatory Visit | Attending: Family Medicine | Admitting: Family Medicine

## 2019-06-03 DIAGNOSIS — F17200 Nicotine dependence, unspecified, uncomplicated: Secondary | ICD-10-CM

## 2019-06-03 DIAGNOSIS — R062 Wheezing: Secondary | ICD-10-CM

## 2021-03-17 NOTE — Progress Notes (Signed)
Cardiology Office Note:    Date:  03/19/2021   ID:  Christopher [name], DOB 10/21/1966, MRN [id number]  PCP:  [name], MD   [facility] Providers Cardiologist:  None {   Referring MD: [name], MD    History of Present Illness:    Christopher [name] is a 55 y.o. male with a hx of HTN and paroxysmal atrial fibrillation who was referred by Dr. Valentina Lucks for further management of Afib.  Patient was previously seen in 2017 by [name] for chest pain. He underwent ETT which was normal with no ischemia. TTE also obtained which revealed LVEF 55%, mild LVH, no significant valve disease.  Today, the patient states he has been feeling off for several weeks. Specifically, he states he has had 2 episodes of presyncope where he had preceding diaphoresis. He sat down and drank some water and symptoms resolved. Had another episode while standing after a long period of time with similar symptoms. Had dizziness last weekend while driving that lasted about before resolving. Also has been having episodes of heart pounding frequently throughout the day. Symptoms last almost all day long. He saw his PCP who noted that he was in Afib and he was started on xarelto and told to follow-up with Cardiology clinic.  The patient continues to have episodes of heart pounding today. Also notes intermittent LE swelling. Prior to these past couple of weeks, he denies any exertional chest pain, SOB, lightheadedness or dizziness. He has not been told on previous visits with his PCP that he was out of rhyhm and he thinks that being back in Afib is new for him. Had prior episode in 2017 prior to his stress test but he flipped back into NSR without recurrence of symptoms until a couple of weeks ago.   Past Medical History:  Diagnosis Date  . Diverticulitis   . Hypercholesteremia   . Hypertension   . Low back pain    recurrent  . OSA (obstructive sleep apnea)    Mild    Past Surgical History:  Procedure  Laterality Date  . LAPAROSCOPIC SIGMOID COLECTOMY      Current Medications: Current Meds  Medication Sig  . amLODipine (NORVASC) 5 MG tablet Take 1 tablet by mouth daily.  Marland Kitchen apixaban (ELIQUIS) 5 MG TABS tablet Take 1 tablet (5 mg total) by mouth 2 (two) times daily.  Marland Kitchen atorvastatin (LIPITOR) 20 MG tablet Take 20 mg by mouth every evening.  . Coenzyme Q10 (COQ-10 PO) Take by mouth.  . furosemide (LASIX) 20 MG tablet Take 1 tablet (20 mg total) by mouth daily as needed for fluid or edema.  . metoprolol succinate (TOPROL XL) 25 MG 24 hr tablet Take 3 tablets (75 mg total) by mouth daily.  . metoprolol tartrate (LOPRESSOR) 25 MG tablet Take 0.5 tablets (12.5 mg total) by mouth 2 (two) times daily as needed (for palpitations).  . [DISCONTINUED] metoprolol succinate (TOPROL-XL) 50 MG 24 hr tablet Take 50 mg by mouth every evening.   . [DISCONTINUED] Rivaroxaban (XARELTO) 15 MG TABS tablet Take 1 tablet by mouth 2 (two) times daily.     Allergies:   Patient has no known allergies.   Social History   Socioeconomic History  . Marital status: Married    Spouse name: Not on file  . Number of children: Not on file  . Years of education: Not on file  . Highest education level: Not on file  Occupational History  . Not on file  Tobacco  Use  . Smoking status: Current Every Day Smoker    Packs/day: 1.00    Types: Cigarettes  . Smokeless tobacco: Never Used  Substance and Sexual Activity  . Alcohol use: Yes  . Drug use: No  . Sexual activity: Not on file  Other Topics Concern  . Not on file  Social History Narrative  . Not on file   Social Determinants of Health   Financial Resource Strain: Not on file  Food Insecurity: Not on file  Transportation Needs: Not on file  Physical Activity: Not on file  Stress: Not on file  Social Connections: Not on file     Family History: The patient's family history is negative for Heart attack.  ROS:   Please see the history of present  illness.    Review of Systems  Constitutional: Negative for chills and fever.  HENT: Negative for hearing loss.   Eyes: Negative for blurred vision.  Respiratory: Positive for shortness of breath.   Cardiovascular: Positive for palpitations and leg swelling. Negative for chest pain, orthopnea, claudication and PND.  Gastrointestinal: Negative for blood in stool, melena, nausea and vomiting.  Genitourinary: Negative for hematuria.  Musculoskeletal: Negative for falls.  Neurological: Positive for dizziness. Negative for loss of consciousness.  Psychiatric/Behavioral: Negative for substance abuse.    EKGs/Labs/Other Studies Reviewed:    The following studies were reviewed today: TTE 2016-11-18: Study Conclusions  - Left ventricle: The cavity size was normal. Wall thickness was  increased in a pattern of mild LVH. The estimated ejection  fraction was 55%. Wall motion was normal; there were no regional  wall motion abnormalities. Left ventricular diastolic function  parameters were normal.  - Aortic valve: There was no stenosis.  - Mitral valve: There was trivial regurgitation.  - Right ventricle: The cavity size was normal. Systolic function  was normal.  - Right atrium: The atrium was mildly dilated.  - Tricuspid valve: Peak RV-RA gradient (S): 31 mm Hg.  - Pulmonary arteries: PA peak pressure: 34 mm Hg (S).  - Inferior vena cava: The vessel was normal in size. The  respirophasic diameter changes were in the normal range (>= 50%),  consistent with normal central venous pressure.   ETT 12/05/16:   Blood pressure demonstrated a normal response to exercise.  There was no ST segment deviation noted during stress.   Good exercise capacity. Normal BP response to exertion. No ischemia.   EKG:  EKG is  ordered today.  The ekg ordered today demonstrates atrial fibrillation with HR 91  Recent Labs: No results found for requested labs within last 8760 hours.  Recent  Lipid Panel No results found for: CHOL, TRIG, HDL, CHOLHDL, VLDL, LDLCALC, LDLDIRECT   Risk Assessment/Calculations:    CHA2DS2-VASc Score = 1  {This indicates a 0.6% annual risk of stroke. The patient's score is based upon: CHF History: No HTN History: Yes Diabetes History: No Stroke History: No Vascular Disease History: No Age Score: 0 Gender Score: 0       Physical Exam:    VS:  BP 118/76   Pulse 91   Ht 5\' 6"  (1.676 m)   Wt 199 lb (90.3 kg)   SpO2 99%   BMI 32.12 kg/m     Wt Readings from Last 3 Encounters:  03/19/21 199 lb (90.3 kg)  10/18/18 180 lb (81.6 kg)  08/20/16 185 lb (83.9 kg)     GEN:  Well nourished, well developed in no acute distress HEENT: Normal NECK: No  JVD; No carotid bruits CARDIAC: Irregularly irregular, no murmurs, rubs, gallops RESPIRATORY:  Clear to auscultation without rales, wheezing or rhonchi  ABDOMEN: Soft, non-tender, non-distended MUSCULOSKELETAL:  Trace LE edema bilaterally  SKIN: Warm and dry NEUROLOGIC:  Alert and oriented x 3 PSYCHIATRIC:  Normal affect   ASSESSMENT:    1. Atrial fibrillation, unspecified type (HCC)   2. Palpitations   3. Dizziness   4. Leg edema   5. Essential hypertension   6. Pure hypercholesterolemia    PLAN:    In order of problems listed above:  #Paroxysmal Afib: #Palpitations: CHADs-vasc 1. Patient with episodes of heart pounding, dizziness and LE edema likely triggered by recurrent Afib. Had isolated episode in 2017 but has not had recurrence since that time that he knows of. Was noted to be in Afib in PCPs office earlier this month and remains in Afib today. Has been on xarelto 15mg  BID for [facility] for the past 2 weeks. Will check TTE, monitor, and change xarelto to apixaban with plans for DCCV if persistent Afib. -Increase metop to 75mg  XL daily -Can take metop tartrate 12.5mg  as needed for palpitations -Change xarelto to apixaban 5mg  BID -Check 3 day zio to assess burden of Afib -TTE -If  persistent Afib, will plan DCCV once on [facility] for [facility] weeks  #Dizziness: Possibly driven by Afib. Also occurs after prolonged standing and therefore orthostasis may be contributing. Will manage Afib as detailed above and encourage hydration and compression socks during work especially with prolonged standing. -Manage Afib as above -Compression socks  -Increase hydration throughout the day  #LE edema: -Compression socks and leg elevation -Can take lasix 20mg  as needed; discussed avoiding on days when he is working due to concern for orthostasis -Check TTE as above  #HTN: -Continue amlodipine 10mg  daily -Increase metop to 75mg  XL daily  #HLD: LDL 113 on 02/26/21. No known CAD -Continue lipitor 20mg  daily -Continue lifestyle modifications   Medication Adjustments/Labs and Tests Ordered: Current medicines are reviewed at length with the patient today.  Concerns regarding medicines are outlined above.  Orders Placed This Encounter  Procedures  . LONG TERM MONITOR (3-14 DAYS)  . EKG 12-Lead  . ECHOCARDIOGRAM COMPLETE   Meds ordered this encounter  Medications  . apixaban (ELIQUIS) 5 MG TABS tablet    Sig: Take 1 tablet (5 mg total) by mouth 2 (two) times daily.    Dispense:  60 tablet    Refill:  3  . metoprolol succinate (TOPROL XL) 25 MG 24 hr tablet    Sig: Take 3 tablets (75 mg total) by mouth daily.    Dispense:  270 tablet    Refill:  1    Dose increase to 75 mg daily  . metoprolol tartrate (LOPRESSOR) 25 MG tablet    Sig: Take 0.5 tablets (12.5 mg total) by mouth 2 (two) times daily as needed (for palpitations).    Dispense:  30 tablet    Refill:  2  . furosemide (LASIX) 20 MG tablet    Sig: Take 1 tablet (20 mg total) by mouth daily as needed for fluid or edema.    Dispense:  30 tablet    Refill:  3    Patient Instructions  Medication Instructions:   STOP TAKING XARELTO NOW  START TAKING ELIQUIS 5 MG BY MOUTH TWICE DAILY (START ON MORNING OF 03/20/21)   START  TAKING LASIX 20 MG BY MOUTH DAILY AS NEEDED FOR LOWER EXTREMITY SWELLING  INCREASE YOUR METOPROLOL SUCCINATE (TOPROL XL)  TO 75 MG BY MOUTH DAILY  YOU MAY TAKE METOPROLOL TARTRATE (LOPRESSOR) 12.5 MG BY MOUTH TWICE DAILY AS NEEDED FOR PALPITATIONS--TAKE NO MORE THAN TWICE DAILY AND ONLY AS NEEDED   *If you need a refill on your cardiac medications before your next appointment, please call your pharmacy*   Testing/Procedures:  Your physician has requested that you have an echocardiogram. Echocardiography is a painless test that uses sound waves to create images of your heart. It provides your doctor with information about the size and shape of your heart and how well your heart's chambers and valves are working. This procedure takes approximately one hour. There are no restrictions for this procedure.   ZIO XT- Long Term Monitor Instructions   Your physician has requested you wear a ZIO patch monitor for _3__ days.  This is a single patch monitor.   [employer] supplies one patch monitor per enrollment. Additional stickers are not available. Please do not apply patch if you will be having a Nuclear Stress Test, Echocardiogram, Cardiac CT, MRI, or Chest Xray during the period you would be wearing the monitor. The patch cannot be worn during these tests. You cannot remove and re-apply the ZIO XT patch monitor.  Your ZIO patch monitor will be sent Fed Ex from [employer] directly to your home address. It may take 3-5 days to receive your monitor after you have been enrolled.  Once you have received your monitor, please review the enclosed instructions. Your monitor has already been registered assigning a specific monitor serial # to you.  Billing and Patient Assistance Program Information   We have supplied [employer] with any of your insurance information on file for billing purposes. [employer] offers a sliding scale Patient Assistance Program for patients that do not have insurance, or whose  insurance does not completely cover the cost of the ZIO monitor.   You must apply for the Patient Assistance Program to qualify for this discounted rate.     To apply, please call [employer] at [phone], select option 4, then select option 2, and ask to apply for Patient Assistance Program.  [employer] will ask your household income, and how many people are in your household.  They will quote your out-of-pocket cost based on that information.  [employer] will also be able to set up a 227-month, interest-free payment plan if needed.  Applying the monitor   Shave hair from upper left chest.  Hold abrader disc by orange tab. Rub abrader in 40 strokes over the upper left chest as indicated in your monitor instructions.  Clean area with 4 enclosed alcohol pads. Let dry.  Apply patch as indicated in monitor instructions. Patch will be placed under collarbone on left side of chest with arrow pointing upward.  Rub patch adhesive wings for 2 minutes. Remove white label marked "1". Remove the white label marked "2". Rub patch adhesive wings for 2 additional minutes.  While looking in a mirror, press and release button in center of patch. A small green light will flash 3-4 times. This will be your only indicator that the monitor has been turned on. ?  Do not shower for the first 24 hours. You may shower after the first 24 hours.  Press the button if you feel a symptom. You will hear a small click. Record Date, Time and Symptom in the Patient Logbook.  When you are ready to remove the patch, follow instructions on the last 2 pages of the Patient Logbook. Stick patch monitor onto the  last page of Patient Logbook.  Place Patient Logbook in the blue and white box.  Use locking tab on box and tape box closed securely.  The blue and white box has prepaid postage on it. Please place it in the mailbox as soon as possible. Your physician should have your test results approximately 7 days after the monitor has been mailed back to  [facility].  Call [facility] Customer Care at [phone] if you have questions regarding your ZIO XT patch monitor. Call them immediately if you see an orange light blinking on your monitor.  If your monitor falls off in less than 4 days, contact our Monitor department at [phone]. ?If your monitor becomes loose or falls off after 4 days call [employer] at [phone] for suggestions on securing your monitor.?   Follow-Up:  2-3 MONTHS IN THE OFFICE WITH DR. Shari Prows OR AN EXTENDER       Signed, [name], MD  03/19/2021 5:47 PM    [facility]

## 2021-03-19 ENCOUNTER — Ambulatory Visit (INDEPENDENT_AMBULATORY_CARE_PROVIDER_SITE_OTHER): Payer: No Typology Code available for payment source

## 2021-03-19 ENCOUNTER — Ambulatory Visit (INDEPENDENT_AMBULATORY_CARE_PROVIDER_SITE_OTHER): Payer: No Typology Code available for payment source | Admitting: Cardiology

## 2021-03-19 ENCOUNTER — Other Ambulatory Visit: Payer: Self-pay

## 2021-03-19 ENCOUNTER — Encounter: Payer: Self-pay | Admitting: Cardiology

## 2021-03-19 ENCOUNTER — Encounter (INDEPENDENT_AMBULATORY_CARE_PROVIDER_SITE_OTHER): Payer: Self-pay

## 2021-03-19 ENCOUNTER — Telehealth: Payer: Self-pay | Admitting: *Deleted

## 2021-03-19 VITALS — BP 118/76 | HR 91 | Ht 66.0 in | Wt 199.0 lb

## 2021-03-19 DIAGNOSIS — I4891 Unspecified atrial fibrillation: Secondary | ICD-10-CM | POA: Diagnosis not present

## 2021-03-19 DIAGNOSIS — I10 Essential (primary) hypertension: Secondary | ICD-10-CM

## 2021-03-19 DIAGNOSIS — R002 Palpitations: Secondary | ICD-10-CM | POA: Diagnosis not present

## 2021-03-19 DIAGNOSIS — R600 Localized edema: Secondary | ICD-10-CM

## 2021-03-19 DIAGNOSIS — E7800 Pure hypercholesterolemia, unspecified: Secondary | ICD-10-CM

## 2021-03-19 DIAGNOSIS — R42 Dizziness and giddiness: Secondary | ICD-10-CM | POA: Diagnosis not present

## 2021-03-19 MED ORDER — METOPROLOL TARTRATE 25 MG PO TABS: 12.5000 mg | ORAL_TABLET | Freq: Two times a day (BID) | ORAL | 2 refills | Status: DC | PRN

## 2021-03-19 MED ORDER — FUROSEMIDE 20 MG PO TABS: 20.0000 mg | ORAL_TABLET | Freq: Every day | ORAL | 3 refills | Status: DC | PRN

## 2021-03-19 MED ORDER — METOPROLOL SUCCINATE ER 25 MG PO TB24: 75.0000 mg | ORAL_TABLET | Freq: Every day | ORAL | 1 refills | Status: DC

## 2021-03-19 MED ORDER — ELIQUIS 5 MG PO TABS: 5.0000 mg | ORAL_TABLET | Freq: Two times a day (BID) | ORAL | 3 refills | Status: DC

## 2021-03-19 NOTE — Patient Instructions (Signed)
Medication Instructions:   STOP TAKING XARELTO NOW  START TAKING ELIQUIS 5 MG BY MOUTH TWICE DAILY (START ON MORNING OF 03/20/21)   START TAKING LASIX 20 MG BY MOUTH DAILY AS NEEDED FOR LOWER EXTREMITY SWELLING  INCREASE YOUR METOPROLOL SUCCINATE (TOPROL XL) TO 75 MG BY MOUTH DAILY  YOU MAY TAKE METOPROLOL TARTRATE (LOPRESSOR) 12.5 MG BY MOUTH TWICE DAILY AS NEEDED FOR PALPITATIONS--TAKE NO MORE THAN TWICE DAILY AND ONLY AS NEEDED   *If you need a refill on your cardiac medications before your next appointment, please call your pharmacy*   Testing/Procedures:  Your physician has requested that you have an echocardiogram. Echocardiography is a painless test that uses sound waves to create images of your heart. It provides your doctor with information about the size and shape of your heart and how well your heart's chambers and valves are working. This procedure takes approximately one hour. There are no restrictions for this procedure.   ZIO XT- Long Term Monitor Instructions   Your physician has requested you wear a ZIO patch monitor for _3__ days.  This is a single patch monitor.   [employer] supplies one patch monitor per enrollment. Additional stickers are not available. Please do not apply patch if you will be having a Nuclear Stress Test, Echocardiogram, Cardiac CT, MRI, or Chest Xray during the period you would be wearing the monitor. The patch cannot be worn during these tests. You cannot remove and re-apply the ZIO XT patch monitor.  Your ZIO patch monitor will be sent Fed Ex from [employer] directly to your home address. It may take 3-5 days to receive your monitor after you have been enrolled.  Once you have received your monitor, please review the enclosed instructions. Your monitor has already been registered assigning a specific monitor serial # to you.  Billing and Patient Assistance Program Information   We have supplied [employer] with any of your insurance  information on file for billing purposes. [employer] offers a sliding scale Patient Assistance Program for patients that do not have insurance, or whose insurance does not completely cover the cost of the ZIO monitor.   You must apply for the Patient Assistance Program to qualify for this discounted rate.     To apply, please call [employer] at [phone], select option 4, then select option 2, and ask to apply for Patient Assistance Program.  Meredeth Ide will ask your household income, and how many people are in your household.  They will quote your out-of-pocket cost based on that information.  [employer] will also be able to set up a 28-month, interest-free payment plan if needed.  Applying the monitor   Shave hair from upper left chest.  Hold abrader disc by orange tab. Rub abrader in 40 strokes over the upper left chest as indicated in your monitor instructions.  Clean area with 4 enclosed alcohol pads. Let dry.  Apply patch as indicated in monitor instructions. Patch will be placed under collarbone on left side of chest with arrow pointing upward.  Rub patch adhesive wings for 2 minutes. Remove white label marked "1". Remove the white label marked "2". Rub patch adhesive wings for 2 additional minutes.  While looking in a mirror, press and release button in center of patch. A small green light will flash 3-4 times. This will be your only indicator that the monitor has been turned on. ?  Do not shower for the first 24 hours. You may shower after the first 24 hours.  Press the  button if you feel a symptom. You will hear a small click. Record Date, Time and Symptom in the Patient Logbook.  When you are ready to remove the patch, follow instructions on the last 2 pages of the Patient Logbook. Stick patch monitor onto the last page of Patient Logbook.  Place Patient Logbook in the blue and white box.  Use locking tab on box and tape box closed securely.  The blue and white box has prepaid postage on it. Please  place it in the mailbox as soon as possible. Your physician should have your test results approximately 7 days after the monitor has been mailed back to [facility].  Call [facility] Customer Care at [phone] if you have questions regarding your ZIO XT patch monitor. Call them immediately if you see an orange light blinking on your monitor.  If your monitor falls off in less than 4 days, contact our Monitor department at [phone]. ?If your monitor becomes loose or falls off after 4 days call [employer] at [phone] for suggestions on securing your monitor.?   Follow-Up:  2-3 MONTHS IN THE OFFICE WITH DR. Shari Prows OR AN EXTENDER

## 2021-03-19 NOTE — Telephone Encounter (Signed)
-----   Message from Delynn Flavin sent at 03/19/2021  2:38 PM EDT ----- Regarding: RE: 3 DAY ZIO PALPITATIONS PER [place] Done :-) ----- Message ----- From: [name], LPN Sent: 6/71/2458   2:34 PM EDT To: Ernst Bowler, Katrina Carmell [place] Subject: 3 DAY ZIO PALPITATIONS PER [place]           Dr. Shari Prows wants this pt to have 3 day zio for palpitations. Order is in and pt needs to be enrolled.  Can you please enroll and let me know.  Thanks, [employer]

## 2021-03-20 ENCOUNTER — Telehealth: Payer: Self-pay

## 2021-03-20 NOTE — Telephone Encounter (Signed)
**Note De-Identified Nesreen Albano Obfuscation** I did a Eliquis PA through covermymeds and received the following message:  Juddson Cobern Key: [id number] - PA Case ID: [id number] Outcome: This medication or product is on your plan's list of covered drugs. Prior authorization is not required at this time. If your pharmacy has questions regarding the processing of your prescription, please have them call the [facility] pharmacy help desk at [phone].  Drug: Eliquis 5MG  tablets Form: [facility] Electronic Prior Authorization Form (2017 NCPDP)

## 2021-03-20 NOTE — Telephone Encounter (Addendum)
**Note De-identified Sumedh Shinsato Obfuscation** -----  **Note De-Identified Caree Wolpert Obfuscation** Message from [name], LPN sent at 11/23/7865  2:52 PM EDT ----- Regarding: NEW START ELIQUIS New start eliquis of Dr. Shari Prows.  Dose is 5 mg po bid. Samples and cards given May need a PA. Can you assist as neeed?

## 2021-03-25 DIAGNOSIS — R002 Palpitations: Secondary | ICD-10-CM

## 2021-04-10 ENCOUNTER — Telehealth: Payer: Self-pay | Admitting: *Deleted

## 2021-04-10 DIAGNOSIS — R9431 Abnormal electrocardiogram [ECG] [EKG]: Secondary | ICD-10-CM

## 2021-04-10 DIAGNOSIS — Z0189 Encounter for other specified special examinations: Secondary | ICD-10-CM

## 2021-04-10 DIAGNOSIS — Z01812 Encounter for preprocedural laboratory examination: Secondary | ICD-10-CM

## 2021-04-10 DIAGNOSIS — I4891 Unspecified atrial fibrillation: Secondary | ICD-10-CM

## 2021-04-10 NOTE — Telephone Encounter (Signed)
[name], LPN  0/97/3532  5:17 PM EDT Back to Top     Spoke with the pts wife [name] (on [place] and pt cannot talk d/t working  3rd shift), and endorsed to her the pts monitor results and recommendations per Dr.Pemberton.  Offered to go ahead and set up his DCCV, as long as he has been taking 3 weeks of anticoagulant as prescribed with no skipped doses. Wife confirmed he has been taking this as prescribed with no skipped doses, since Eliquis provided atoffice visit on 5/24.  Wife did state that she will have to get back with me in about a week or so,  to endorse a good time to arrange for the pt to have his DCCV scheduled. Wife states this is due to pts work schedule and upcoming vacations. Informed the pts wife that would be fine, but he will then need to come into the office for a quick office visit, so that we can update his EKG, H&P, and do pre-procedurelabs at that time, per procedure policy.  Wife states that would work out even better for them, for they can both sit down, coordinate some dates to get his DCCV done, and get all pre-procedure work-up and instructions done in one sitting.  Scheduled the pt to come in for a quick visit to update everything, with Dr. Shari Prows for 6/28 at 10 am end slot.  Note placed to obtain EKG, labs, update H&P, and provide DCCVinstructions at that time.  Wife verbalized understanding and agrees with this plan.  Wife will endorsethis to the pt when he gets home from work tomorrow.  Will send this message to Dr. Shari Prows as an Lorain Childes.

## 2021-04-10 NOTE — Telephone Encounter (Signed)
-----   Message from [name], MD sent at 04/10/2021  2:50 PM EDT ----- His monitor shows he is in persistent Afib. Can we get him set up for DCCV after 3 weeks of anticoagulation?

## 2021-04-18 ENCOUNTER — Other Ambulatory Visit: Payer: Self-pay

## 2021-04-18 ENCOUNTER — Ambulatory Visit (HOSPITAL_COMMUNITY): Payer: No Typology Code available for payment source | Attending: Cardiology

## 2021-04-18 DIAGNOSIS — R002 Palpitations: Secondary | ICD-10-CM | POA: Diagnosis present

## 2021-04-18 DIAGNOSIS — I4891 Unspecified atrial fibrillation: Secondary | ICD-10-CM

## 2021-04-19 LAB — ECHOCARDIOGRAM COMPLETE: S' Lateral: 3.1 cm

## 2021-04-21 NOTE — H&P (View-Only) (Signed)
Cardiology Office Note:    Date:  04/23/2021   ID:  Christopher [name], DOB [birth year], MRN [id number]  PCP:  Maurice SmallGriffin, Elaine, MD   [facility] Providers Cardiologist:  None {   Referring MD: Maurice SmallGriffin, Elaine, MD    History of Present Illness:    Christopher SchneidersDavid Wellnitz is a 55 y.o. male with a hx of HTN and paroxysmal atrial fibrillation who presents to clinic for follow-up of his Afib.  Patient was previously seen in 2017 by Dr. Eldridge DaceVaranasi for chest pain. He underwent ETT which was normal with no ischemia. TTE also obtained which revealed LVEF 55%, mild LVH, no significant valve disease.  Was last seen in clinic on 03/19/21 for symptomatic Afib. 3 day zio demonstrated persistent Afib throughout with average HR 106bpm. TTE 04/18/21 with LVEF 55-60%, normal RV, no significant valve disease. We increased his metop to 75mg  daily and changed xarelto to apixaban.  Today, the patient feels overall well. No chest pain or palpitations. Continues to have mild SOB with exertion and mild dizziness with position changes. Has trace LE edema but has not taken lasix. He remains in Afib today and is interested in a DCCV. Has not missed any doses of apixaban and is tolerating the medication without bleeding issues.   Past Medical History:  Diagnosis Date   Diverticulitis    Hypercholesteremia    Hypertension    Low back pain    recurrent   OSA (obstructive sleep apnea)    Mild    Past Surgical History:  Procedure Laterality Date   LAPAROSCOPIC SIGMOID COLECTOMY      Current Medications: Current Meds  Medication Sig   amLODipine (NORVASC) 5 MG tablet Take 1 tablet by mouth daily.   apixaban (ELIQUIS) 5 MG TABS tablet Take 1 tablet (5 mg total) by mouth 2 (two) times daily.   atorvastatin (LIPITOR) 20 MG tablet Take 20 mg by mouth every evening.   Coenzyme Q10 (COQ-10 PO) Take by mouth.   furosemide (LASIX) 20 MG tablet Take 1 tablet (20 mg total) by mouth daily as needed for fluid or edema.    metoprolol succinate (TOPROL XL) 25 MG 24 hr tablet Take 3 tablets (75 mg total) by mouth daily.   metoprolol tartrate (LOPRESSOR) 25 MG tablet Take 0.5 tablets (12.5 mg total) by mouth 2 (two) times daily as needed (for palpitations).     Allergies:   Patient has no known allergies.   Social History   Socioeconomic History   Marital status: Married    Spouse name: Not on file   Number of children: Not on file   Years of education: Not on file   Highest education level: Not on file  Occupational History   Not on file  Tobacco Use   Smoking status: Every Day    Packs/day: 1.00    Pack years: 0.00    Types: Cigarettes   Smokeless tobacco: Never  Substance and Sexual Activity   Alcohol use: Yes   Drug use: No   Sexual activity: Not on file  Other Topics Concern   Not on file  Social History Narrative   Not on file   Social Determinants of Health   Financial Resource Strain: Not on file  Food Insecurity: Not on file  Transportation Needs: Not on file  Physical Activity: Not on file  Stress: Not on file  Social Connections: Not on file     Family History: The patient's family history is negative for Heart attack.  ROS:  Please see the history of present illness.    Review of Systems  Constitutional:  Negative for chills and fever.  HENT:  Negative for sore throat.   Eyes:  Negative for blurred vision.  Respiratory:  Positive for shortness of breath.   Cardiovascular:  Positive for palpitations and leg swelling. Negative for chest pain, orthopnea, claudication and PND.  Gastrointestinal:  Negative for blood in stool, melena, nausea and vomiting.  Genitourinary:  Negative for hematuria.  Musculoskeletal:  Negative for falls.  Neurological:  Positive for dizziness. Negative for loss of consciousness.  Psychiatric/Behavioral:  Negative for substance abuse.    EKGs/Labs/Other Studies Reviewed:    The following studies were reviewed today: Cardiac Monitor  04/05/21: Patch wear time was 3 days and 1 hour Patient was in continuous Afib throughout the monitoring period (100% burden) with HR ranging from 62-164 with averag HR 106 Rare ventricular ectopy No significant pauses  TTE 04/18/21: IMPRESSIONS     1. Left ventricular ejection fraction, by estimation, is 55 to 60%. The  left ventricle has normal function. The left ventricle has no regional  wall motion abnormalities. Left ventricular diastolic function could not  be evaluated.   2. Right ventricular systolic function is normal. The right ventricular  size is normal. There is normal pulmonary artery systolic pressure.   3. The mitral valve is normal in structure. Trivial mitral valve  regurgitation. No evidence of mitral stenosis.   4. The aortic valve is tricuspid. Aortic valve regurgitation is not  visualized. No aortic stenosis is present.   5. The inferior vena cava is normal in size with greater than 50%  respiratory variability, suggesting right atrial pressure of 3 mmHg.   Comparison(s): No significant change from prior study. 11/03/16 EF 55%. PA  pressure .   TTE 11/03/16: Study Conclusions  - Left ventricle: The cavity size was normal. Wall thickness was    increased in a pattern of mild LVH. The estimated ejection    fraction was 55%. Wall motion was normal; there were no regional    wall motion abnormalities. Left ventricular diastolic function    parameters were normal.  - Aortic valve: There was no stenosis.  - Mitral valve: There was trivial regurgitation.  - Right ventricle: The cavity size was normal. Systolic function    was normal.  - Right atrium: The atrium was mildly dilated.  - Tricuspid valve: Peak RV-RA gradient (S): 31 mm Hg.  - Pulmonary arteries: PA peak pressure: 34 mm Hg (S).  - Inferior vena cava: The vessel was normal in size. The    respirophasic diameter changes were in the normal range (>= 50%),    consistent with normal central venous  pressure.   ETT 12/05/16:  Blood pressure demonstrated a normal response to exercise. There was no ST segment deviation noted during stress.   Good exercise capacity. Normal BP response to exertion. No ischemia.   EKG:  EKG is  ordered today.  The ekg ordered today demonstrates atrial fibrillation with HR 91  Recent Labs: No results found for requested labs within last 8760 hours.  Recent Lipid Panel No results found for: CHOL, TRIG, HDL, CHOLHDL, VLDL, LDLCALC, LDLDIRECT   Risk Assessment/Calculations:    CHA2DS2-VASc Score = 1  {This indicates a 0.6% annual risk of stroke. The patient's score is based upon: CHF History: No HTN History: Yes Diabetes History: No Stroke History: No Vascular Disease History: No Age Score: 0 Gender Score: 0  Physical Exam:    VS:  BP 118/76   Pulse 88   Ht 5\' 6"  (1.676 m)   Wt 199 lb 6.4 oz (90.4 kg)   SpO2 96%   BMI 32.18 kg/m     Wt Readings from Last 3 Encounters:  04/23/21 199 lb 6.4 oz (90.4 kg)  03/19/21 199 lb (90.3 kg)  10/18/18 180 lb (81.6 kg)     GEN:  Comfortable, NAD HEENT: Normal NECK: No JVD; No carotid bruits CARDIAC: Irregularly irregular, no murmurs, rubs, gallops RESPIRATORY:  Clear to auscultation without rales, wheezing or rhonchi  ABDOMEN: Soft, non-tender, non-distended MUSCULOSKELETAL:  Trace LE edema bilaterally. Warm SKIN: Warm and dry NEUROLOGIC:  Alert and oriented x 3 PSYCHIATRIC:  Normal affect   ASSESSMENT:    1. Atrial fibrillation, unspecified type (HCC)   2. Dizziness   3. Essential hypertension   4. Pure hypercholesterolemia   5. Leg edema    PLAN:    In order of problems listed above:  #Paroxysmal Afib: #Palpitations: CHADs-vasc 1. Symptomatic with mild DOE and episodes of lightheadedness. Had prior isolated episode of Afib in 2017 but did not have recurrence until 02/2021. Remains in Afib today. Will plan on DCCV; has not missed doses of apixaban. -Plan for DCCV; has  not missed any doses of apixaban -Continue metop 75mg  XL daily -Can take metop tartrate 12.5mg  as needed for palpitations -Continue apixaban 5mg  BID -Consider flec in the future; patient and wife will discuss further  #Dizziness: Possibly driven by Afib. Also occurs after prolonged standing and therefore orthostasis may be contributing. Will manage Afib as detailed above and encourage hydration and compression socks during work especially with prolonged standing. -Manage Afib as above -Compression socks  -Increase hydration throughout the day  #LE edema: TTE with normal LVEF, no significant valve disease.  -Compression socks and leg elevation -Can take lasix 20mg  as needed; discussed avoiding on days when he is working due to concern for orthostasis  #HTN: Well controlled and at goal <120s/80s. -Continue amlodipine 10mg  daily -Continue metop 75mg  XL daily  #HLD: LDL 113 on 02/26/21. No known CAD -Continue lipitor 20mg  daily -Continue lifestyle modifications   Medication Adjustments/Labs and Tests Ordered: Current medicines are reviewed at length with the patient today.  Concerns regarding medicines are outlined above.  Orders Placed This Encounter  Procedures   EKG 12-Lead   No orders of the defined types were placed in this encounter.   Patient Instructions  Medication Instructions:   Your physician recommends that you continue on your current medications as directed. Please refer to the Current Medication list given to you today.   *If you need a refill on your cardiac medications before your next appointment, please call your pharmacy*   Testing/Procedures:  Dear Mr. Cayman Kielbasa are scheduled for a  Cardioversion on Friday 05/17/21 with Dr. .  Please arrive at the [facility] (Main Entrance A) at [facility]: [address] [place], Sunday 05/19/21 at 8:30 am (1 hour prior to procedure unless lab work is needed; if lab work is needed arrive 1.5 hours  ahead)  DIET: Nothing to eat or drink after midnight except a sip of water with medications (see medication instructions below)  FYI: For your safety, and to allow Rennis Golden to monitor your vital signs accurately during the surgery/procedure we request that   if you have artificial nails, gel coating, SNS etc. Please have those removed prior to your surgery/procedure. Not having the nail coverings /  polish removed may result in cancellation or delay of your surgery/procedure.   Medication Instructions: Hold FUROSEMIDE (LASIX) THE MORNING OF YOUR PROCEDURE  Continue your anticoagulant: ELIQUIS You will need to continue your anticoagulant after your procedure until you are told by your Provider that it is safe to stop   Labs: SAME DAY AS CARDIOVERSION--WILL BE AT HOSPITAL AT 8 :30 AM ON 7/22 TO HAVE LABS DONE PRIOR TO PROCEDURE  Come to: LAB WORK SAME DAY AS CARDIOVERSION--BE AT [facility] AT 8:30 AM ON 05/17/21  (Lab option #3) your lab work will be done at the hospital prior to your procedure - you will need to arrive 1  hours ahead of your procedure  You must have a responsible person to drive you home and stay in the waiting area during your procedure. Failure to do so could result in cancellation.  Bring your insurance cards.  *Special Note: Every effort is made to have your procedure done on time. Occasionally there are emergencies that occur at the hospital that may cause delays. Please be patient if a delay does occur.    Follow-Up:  AS PREVIOUSLY SCHEDULED WITH OUR CLINIC    Signed, [name], MD  04/23/2021 10:52 AM    [facility]

## 2021-04-21 NOTE — Progress Notes (Signed)
Cardiology Office Note:    Date:  04/23/2021   ID:  Christopher [name], DOB 07/06/1966, MRN [id number]  PCP:  Griffin, Elaine, MD   [facility] Providers Cardiologist:  None {   Referring MD: Griffin, Elaine, MD    History of Present Illness:    Christopher [name] is a 55 y.o. male with a hx of HTN and paroxysmal atrial fibrillation who presents to clinic for follow-up of his Afib.  Patient was previously seen in 2017 by Dr. Varanasi for chest pain. He underwent ETT which was normal with no ischemia. TTE also obtained which revealed LVEF 55%, mild LVH, no significant valve disease.  Was last seen in clinic on 03/19/21 for symptomatic Afib. 3 day zio demonstrated persistent Afib throughout with average HR 106bpm. TTE 04/18/21 with LVEF 55-60%, normal RV, no significant valve disease. We increased his metop to 75mg daily and changed xarelto to apixaban.  Today, the patient feels overall well. No chest pain or palpitations. Continues to have mild SOB with exertion and mild dizziness with position changes. Has trace LE edema but has not taken lasix. He remains in Afib today and is interested in a DCCV. Has not missed any doses of apixaban and is tolerating the medication without bleeding issues.   Past Medical History:  Diagnosis Date   Diverticulitis    Hypercholesteremia    Hypertension    Low back pain    recurrent   OSA (obstructive sleep apnea)    Mild    Past Surgical History:  Procedure Laterality Date   LAPAROSCOPIC SIGMOID COLECTOMY      Current Medications: Current Meds  Medication Sig   amLODipine (NORVASC) 5 MG tablet Take 1 tablet by mouth daily.   apixaban (ELIQUIS) 5 MG TABS tablet Take 1 tablet (5 mg total) by mouth 2 (two) times daily.   atorvastatin (LIPITOR) 20 MG tablet Take 20 mg by mouth every evening.   Coenzyme Q10 (COQ-10 PO) Take by mouth.   furosemide (LASIX) 20 MG tablet Take 1 tablet (20 mg total) by mouth daily as needed for fluid or edema.    metoprolol succinate (TOPROL XL) 25 MG 24 hr tablet Take 3 tablets (75 mg total) by mouth daily.   metoprolol tartrate (LOPRESSOR) 25 MG tablet Take 0.5 tablets (12.5 mg total) by mouth 2 (two) times daily as needed (for palpitations).     Allergies:   Patient has no known allergies.   Social History   Socioeconomic History   Marital status: Married    Spouse name: Not on file   Number of children: Not on file   Years of education: Not on file   Highest education level: Not on file  Occupational History   Not on file  Tobacco Use   Smoking status: Every Day    Packs/day: 1.00    Pack years: 0.00    Types: Cigarettes   Smokeless tobacco: Never  Substance and Sexual Activity   Alcohol use: Yes   Drug use: No   Sexual activity: Not on file  Other Topics Concern   Not on file  Social History Narrative   Not on file   Social Determinants of Health   Financial Resource Strain: Not on file  Food Insecurity: Not on file  Transportation Needs: Not on file  Physical Activity: Not on file  Stress: Not on file  Social Connections: Not on file     Family History: The patient's family history is negative for Heart attack.  ROS:     Please see the history of present illness.    Review of Systems  Constitutional:  Negative for chills and fever.  HENT:  Negative for sore throat.   Eyes:  Negative for blurred vision.  Respiratory:  Positive for shortness of breath.   Cardiovascular:  Positive for palpitations and leg swelling. Negative for chest pain, orthopnea, claudication and PND.  Gastrointestinal:  Negative for blood in stool, melena, nausea and vomiting.  Genitourinary:  Negative for hematuria.  Musculoskeletal:  Negative for falls.  Neurological:  Positive for dizziness. Negative for loss of consciousness.  Psychiatric/Behavioral:  Negative for substance abuse.    EKGs/Labs/Other Studies Reviewed:    The following studies were reviewed today: Cardiac Monitor  04/05/21: Patch wear time was 3 days and 1 hour Patient was in continuous Afib throughout the monitoring period (100% burden) with HR ranging from 62-164 with averag HR 106 Rare ventricular ectopy No significant pauses  TTE 04/18/21: IMPRESSIONS     1. Left ventricular ejection fraction, by estimation, is 55 to 60%. The  left ventricle has normal function. The left ventricle has no regional  wall motion abnormalities. Left ventricular diastolic function could not  be evaluated.   2. Right ventricular systolic function is normal. The right ventricular  size is normal. There is normal pulmonary artery systolic pressure.   3. The mitral valve is normal in structure. Trivial mitral valve  regurgitation. No evidence of mitral stenosis.   4. The aortic valve is tricuspid. Aortic valve regurgitation is not  visualized. No aortic stenosis is present.   5. The inferior vena cava is normal in size with greater than 50%  respiratory variability, suggesting right atrial pressure of 3 mmHg.   Comparison(s): No significant change from prior study. 11/03/16 EF 55%. PA  pressure .   TTE 11/03/16: Study Conclusions  - Left ventricle: The cavity size was normal. Wall thickness was    increased in a pattern of mild LVH. The estimated ejection    fraction was 55%. Wall motion was normal; there were no regional    wall motion abnormalities. Left ventricular diastolic function    parameters were normal.  - Aortic valve: There was no stenosis.  - Mitral valve: There was trivial regurgitation.  - Right ventricle: The cavity size was normal. Systolic function    was normal.  - Right atrium: The atrium was mildly dilated.  - Tricuspid valve: Peak RV-RA gradient (S): 31 mm Hg.  - Pulmonary arteries: PA peak pressure: 34 mm Hg (S).  - Inferior vena cava: The vessel was normal in size. The    respirophasic diameter changes were in the normal range (>= 50%),    consistent with normal central venous  pressure.   ETT 12/05/16:  Blood pressure demonstrated a normal response to exercise. There was no ST segment deviation noted during stress.   Good exercise capacity. Normal BP response to exertion. No ischemia.   EKG:  EKG is  ordered today.  The ekg ordered today demonstrates atrial fibrillation with HR 91  Recent Labs: No results found for requested labs within last 8760 hours.  Recent Lipid Panel No results found for: CHOL, TRIG, HDL, CHOLHDL, VLDL, LDLCALC, LDLDIRECT   Risk Assessment/Calculations:    CHA2DS2-VASc Score = 1  {This indicates a 0.6% annual risk of stroke. The patient's score is based upon: CHF History: No HTN History: Yes Diabetes History: No Stroke History: No Vascular Disease History: No Age Score: 0 Gender Score: 0  Physical Exam:    VS:  BP 118/76   Pulse 88   Ht 5\' 6"  (1.676 m)   Wt 199 lb 6.4 oz (90.4 kg)   SpO2 96%   BMI 32.18 kg/m     Wt Readings from Last 3 Encounters:  04/23/21 199 lb 6.4 oz (90.4 kg)  03/19/21 199 lb (90.3 kg)  10/18/18 180 lb (81.6 kg)     GEN:  Comfortable, NAD HEENT: Normal NECK: No JVD; No carotid bruits CARDIAC: Irregularly irregular, no murmurs, rubs, gallops RESPIRATORY:  Clear to auscultation without rales, wheezing or rhonchi  ABDOMEN: Soft, non-tender, non-distended MUSCULOSKELETAL:  Trace LE edema bilaterally. Warm SKIN: Warm and dry NEUROLOGIC:  Alert and oriented x 3 PSYCHIATRIC:  Normal affect   ASSESSMENT:    1. Atrial fibrillation, unspecified type (HCC)   2. Dizziness   3. Essential hypertension   4. Pure hypercholesterolemia   5. Leg edema    PLAN:    In order of problems listed above:  #Paroxysmal Afib: #Palpitations: CHADs-vasc 1. Symptomatic with mild DOE and episodes of lightheadedness. Had prior isolated episode of Afib in 2017 but did not have recurrence until 02/2021. Remains in Afib today. Will plan on DCCV; has not missed doses of apixaban. -Plan for DCCV; has  not missed any doses of apixaban -Continue metop 75mg  XL daily -Can take metop tartrate 12.5mg  as needed for palpitations -Continue apixaban 5mg  BID -Consider flec in the future; patient and wife will discuss further  #Dizziness: Possibly driven by Afib. Also occurs after prolonged standing and therefore orthostasis may be contributing. Will manage Afib as detailed above and encourage hydration and compression socks during work especially with prolonged standing. -Manage Afib as above -Compression socks  -Increase hydration throughout the day  #LE edema: TTE with normal LVEF, no significant valve disease.  -Compression socks and leg elevation -Can take lasix 20mg  as needed; discussed avoiding on days when he is working due to concern for orthostasis  #HTN: Well controlled and at goal <120s/80s. -Continue amlodipine 10mg  daily -Continue metop 75mg  XL daily  #HLD: LDL 113 on 02/26/21. No known CAD -Continue lipitor 20mg  daily -Continue lifestyle modifications   Medication Adjustments/Labs and Tests Ordered: Current medicines are reviewed at length with the patient today.  Concerns regarding medicines are outlined above.  Orders Placed This Encounter  Procedures   EKG 12-Lead   No orders of the defined types were placed in this encounter.   Patient Instructions  Medication Instructions:   Your physician recommends that you continue on your current medications as directed. Please refer to the Current Medication list given to you today.   *If you need a refill on your cardiac medications before your next appointment, please call your pharmacy*   Testing/Procedures:  Dear Mr. Cayman Kielbasa are scheduled for a  Cardioversion on Friday 05/17/21 with Dr. .  Please arrive at the [facility] (Main Entrance A) at [facility]: [address] [place], Sunday 05/19/21 at 8:30 am (1 hour prior to procedure unless lab work is needed; if lab work is needed arrive 1.5 hours  ahead)  DIET: Nothing to eat or drink after midnight except a sip of water with medications (see medication instructions below)  FYI: For your safety, and to allow Rennis Golden to monitor your vital signs accurately during the surgery/procedure we request that   if you have artificial nails, gel coating, SNS etc. Please have those removed prior to your surgery/procedure. Not having the nail coverings /  polish removed may result in cancellation or delay of your surgery/procedure.   Medication Instructions: Hold FUROSEMIDE (LASIX) THE MORNING OF YOUR PROCEDURE  Continue your anticoagulant: ELIQUIS You will need to continue your anticoagulant after your procedure until you are told by your Provider that it is safe to stop   Labs: SAME DAY AS CARDIOVERSION--WILL BE AT HOSPITAL AT 8 :30 AM ON 7/22 TO HAVE LABS DONE PRIOR TO PROCEDURE  Come to: LAB WORK SAME DAY AS CARDIOVERSION--BE AT [facility] AT 8:30 AM ON 05/17/21  (Lab option #3) your lab work will be done at the hospital prior to your procedure - you will need to arrive 1  hours ahead of your procedure  You must have a responsible person to drive you home and stay in the waiting area during your procedure. Failure to do so could result in cancellation.  Bring your insurance cards.  *Special Note: Every effort is made to have your procedure done on time. Occasionally there are emergencies that occur at the hospital that may cause delays. Please be patient if a delay does occur.    Follow-Up:  AS PREVIOUSLY SCHEDULED WITH OUR CLINIC    Signed, [name], MD  04/23/2021 10:52 AM    [facility]

## 2021-04-23 ENCOUNTER — Ambulatory Visit (INDEPENDENT_AMBULATORY_CARE_PROVIDER_SITE_OTHER): Payer: No Typology Code available for payment source | Admitting: Cardiology

## 2021-04-23 ENCOUNTER — Other Ambulatory Visit: Payer: Self-pay

## 2021-04-23 ENCOUNTER — Other Ambulatory Visit: Payer: Self-pay | Admitting: Cardiology

## 2021-04-23 ENCOUNTER — Encounter: Payer: Self-pay | Admitting: Cardiology

## 2021-04-23 ENCOUNTER — Other Ambulatory Visit: Payer: No Typology Code available for payment source

## 2021-04-23 VITALS — BP 118/76 | HR 88 | Ht 66.0 in | Wt 199.4 lb

## 2021-04-23 DIAGNOSIS — Z0189 Encounter for other specified special examinations: Secondary | ICD-10-CM

## 2021-04-23 DIAGNOSIS — E7800 Pure hypercholesterolemia, unspecified: Secondary | ICD-10-CM | POA: Diagnosis not present

## 2021-04-23 DIAGNOSIS — I4891 Unspecified atrial fibrillation: Secondary | ICD-10-CM

## 2021-04-23 DIAGNOSIS — R9431 Abnormal electrocardiogram [ECG] [EKG]: Secondary | ICD-10-CM

## 2021-04-23 DIAGNOSIS — Z01812 Encounter for preprocedural laboratory examination: Secondary | ICD-10-CM

## 2021-04-23 DIAGNOSIS — R42 Dizziness and giddiness: Secondary | ICD-10-CM | POA: Diagnosis not present

## 2021-04-23 DIAGNOSIS — I10 Essential (primary) hypertension: Secondary | ICD-10-CM | POA: Diagnosis not present

## 2021-04-23 DIAGNOSIS — R600 Localized edema: Secondary | ICD-10-CM

## 2021-04-23 NOTE — Patient Instructions (Addendum)
Medication Instructions:   Your physician recommends that you continue on your current medications as directed. Please refer to the Current Medication list given to you today.   *If you need a refill on your cardiac medications before your next appointment, please call your pharmacy*   Testing/Procedures:  Dear [name] are scheduled for a  Cardioversion on Friday 05/17/21 with Dr. Rennis Golden.  Please arrive at the [facility] (Main Entrance A) at [facility]: [address] [place], [place] [address] at 8:30 am (1 hour prior to procedure unless lab work is needed; if lab work is needed arrive 1.5 hours ahead)  DIET: Nothing to eat or drink after midnight except a sip of water with medications (see medication instructions below)  FYI: For your safety, and to allow [place] to monitor your vital signs accurately during the surgery/procedure we request that   if you have artificial nails, gel coating, SNS etc. Please have those removed prior to your surgery/procedure. Not having the nail coverings /polish removed may result in cancellation or delay of your surgery/procedure.   Medication Instructions: Hold FUROSEMIDE (LASIX) THE MORNING OF YOUR PROCEDURE  Continue your anticoagulant: ELIQUIS You will need to continue your anticoagulant after your procedure until you are told by your Provider that it is safe to stop   Labs: SAME DAY AS CARDIOVERSION--WILL BE AT HOSPITAL AT 8 :30 AM ON 7/22 TO HAVE LABS DONE PRIOR TO PROCEDURE  Come to: LAB WORK SAME DAY AS CARDIOVERSION--BE AT [facility] AT 8:30 AM ON 05/17/21  (Lab option #3) your lab work will be done at the hospital prior to your procedure - you will need to arrive 1  hours ahead of your procedure  You must have a responsible person to drive you home and stay in the waiting area during your procedure. Failure to do so could result in cancellation.  Bring your insurance cards.  *Special Note: Every effort is made to have  your procedure done on time. Occasionally there are emergencies that occur at the hospital that may cause delays. Please be patient if a delay does occur.    Follow-Up:  AS PREVIOUSLY SCHEDULED WITH OUR CLINIC

## 2021-04-25 ENCOUNTER — Other Ambulatory Visit: Payer: Self-pay | Admitting: Sports Medicine

## 2021-04-25 DIAGNOSIS — M25511 Pain in right shoulder: Secondary | ICD-10-CM

## 2021-05-04 ENCOUNTER — Ambulatory Visit
Admission: RE | Admit: 2021-05-04 | Discharge: 2021-05-04 | Disposition: A | Discharge status: Home / Self Care | Payer: No Typology Code available for payment source | Source: Ambulatory Visit | Attending: Sports Medicine | Admitting: Sports Medicine

## 2021-05-04 ENCOUNTER — Other Ambulatory Visit: Payer: Self-pay

## 2021-05-04 DIAGNOSIS — M25511 Pain in right shoulder: Secondary | ICD-10-CM

## 2021-05-17 ENCOUNTER — Other Ambulatory Visit: Payer: Self-pay

## 2021-05-17 ENCOUNTER — Encounter (HOSPITAL_COMMUNITY)
Admission: RE | Disposition: A | Discharge status: Home / Self Care | Payer: Self-pay | Source: Home / Self Care | Attending: Internal Medicine

## 2021-05-17 ENCOUNTER — Encounter (HOSPITAL_COMMUNITY): Payer: Self-pay | Admitting: Internal Medicine

## 2021-05-17 ENCOUNTER — Ambulatory Visit (HOSPITAL_COMMUNITY)
Admission: RE | Admit: 2021-05-17 | Discharge: 2021-05-17 | Disposition: A | Discharge status: Home / Self Care | Payer: No Typology Code available for payment source | Attending: Internal Medicine | Admitting: Internal Medicine

## 2021-05-17 ENCOUNTER — Ambulatory Visit (HOSPITAL_COMMUNITY): Payer: No Typology Code available for payment source | Admitting: Certified Registered Nurse Anesthetist

## 2021-05-17 DIAGNOSIS — I480 Paroxysmal atrial fibrillation: Secondary | ICD-10-CM | POA: Diagnosis not present

## 2021-05-17 DIAGNOSIS — F17210 Nicotine dependence, cigarettes, uncomplicated: Secondary | ICD-10-CM | POA: Diagnosis not present

## 2021-05-17 DIAGNOSIS — I10 Essential (primary) hypertension: Secondary | ICD-10-CM | POA: Diagnosis not present

## 2021-05-17 DIAGNOSIS — I4891 Unspecified atrial fibrillation: Secondary | ICD-10-CM | POA: Diagnosis present

## 2021-05-17 DIAGNOSIS — Z7901 Long term (current) use of anticoagulants: Secondary | ICD-10-CM | POA: Diagnosis not present

## 2021-05-17 DIAGNOSIS — I4819 Other persistent atrial fibrillation: Secondary | ICD-10-CM | POA: Diagnosis not present

## 2021-05-17 DIAGNOSIS — Z79899 Other long term (current) drug therapy: Secondary | ICD-10-CM | POA: Diagnosis not present

## 2021-05-17 HISTORY — PX: CARDIOVERSION: SHX1299

## 2021-05-17 LAB — POCT I-STAT, CHEM 8
BUN: 24 mg/dL — ABNORMAL HIGH (ref 6–20)
Calcium, Ion: 1.21 mmol/L (ref 1.15–1.40)
Chloride: 106 mmol/L (ref 98–111)
Creatinine, Ser: 0.9 mg/dL (ref 0.61–1.24)
Glucose, Bld: 98 mg/dL (ref 70–99)
HCT: 51 % (ref 39.0–52.0)
Hemoglobin: 17.3 g/dL — ABNORMAL HIGH (ref 13.0–17.0)
Potassium: 3.9 mmol/L (ref 3.5–5.1)
Sodium: 141 mmol/L (ref 135–145)
TCO2: 23 mmol/L (ref 22–32)

## 2021-05-17 SURGERY — CARDIOVERSION
Anesthesia: General

## 2021-05-17 MED ORDER — PROPOFOL 10 MG/ML IV BOLUS
INTRAVENOUS | Status: DC | PRN
  Administered 2021-05-17: 90 mg via INTRAVENOUS

## 2021-05-17 MED ORDER — SODIUM CHLORIDE 0.9 % IV SOLN: INTRAVENOUS | Status: DC | PRN

## 2021-05-17 MED ORDER — LIDOCAINE 2% (20 MG/ML) 5 ML SYRINGE
INTRAMUSCULAR | Status: DC | PRN
  Administered 2021-05-17: 80 mg via INTRAVENOUS

## 2021-05-17 NOTE — Anesthesia Preprocedure Evaluation (Signed)
Anesthesia Evaluation  Patient identified by MRN, date of birth, ID band Patient awake    Reviewed: Allergy & Precautions, NPO status , Patient's Chart, lab work & pertinent test results  Airway Mallampati: II  TM Distance: >3 FB     Dental   Pulmonary sleep apnea , Current Smoker and Patient abstained from smoking.,    breath sounds clear to auscultation       Cardiovascular hypertension,  Rhythm:Regular Rate:Normal     Neuro/Psych    GI/Hepatic   Endo/Other    Renal/GU      Musculoskeletal   Abdominal   Peds  Hematology   Anesthesia Other Findings   Reproductive/Obstetrics                             Anesthesia Physical Anesthesia Plan  ASA: 3  Anesthesia Plan: General   Post-op Pain Management:    Induction: Intravenous  PONV Risk Score and Plan: Treatment may vary due to age or medical condition  Airway Management Planned: Oral ETT, Nasal Cannula and Simple Face Mask  Additional Equipment:   Intra-op Plan:   Post-operative Plan:   Informed Consent: I have reviewed the patients History and Physical, chart, labs and discussed the procedure including the risks, benefits and alternatives for the proposed anesthesia with the patient or authorized representative who has indicated his/her understanding and acceptance.     Dental advisory given  Plan Discussed with: CRNA and Anesthesiologist  Anesthesia Plan Comments:         Anesthesia Quick Evaluation

## 2021-05-17 NOTE — Transfer of Care (Signed)
Immediate Anesthesia Transfer of Care Note  Patient: Christopher [name]  Procedure(s) Performed: CARDIOVERSION  Patient Location: Endoscopy Unit  Anesthesia Type:General  Level of Consciousness: awake, alert  and oriented  Airway & Oxygen Therapy: Patient Spontanous Breathing  Post-op Assessment: Report given to RN and Post -op Vital signs reviewed and stable  Post vital signs: Reviewed and stable  Last Vitals:  Vitals Value Taken Time  BP 128/92   Temp    Pulse 65   Resp 12   SpO2 96     Last Pain:  Vitals:   05/17/21 0833  TempSrc: Oral  PainSc: 0-No pain         Complications: No notable events documented.

## 2021-05-17 NOTE — Discharge Instructions (Signed)

## 2021-05-17 NOTE — Anesthesia Procedure Notes (Signed)
Procedure Name: General with mask airway Date/Time: 05/17/2021 9:42 AM Performed by: Lelon Perla, CRNA Pre-anesthesia Checklist: Patient identified, Emergency Drugs available, Suction available, Patient being monitored and Timeout performed Patient Re-evaluated:Patient Re-evaluated prior to induction Oxygen Delivery Method: Supernova nasal CPAP Preoxygenation: Pre-oxygenation with 100% oxygen Induction Type: IV induction Ventilation: Mask ventilation without difficulty Placement Confirmation: positive ETCO2 Dental Injury: Teeth and Oropharynx as per pre-operative assessment

## 2021-05-17 NOTE — CV Procedure (Addendum)
   CARDIOVERSION NOTE  Procedure: Electrical Cardioversion Indications:  Atrial Fibrillation  Procedure Details:  Consent: Risks of procedure as well as the alternatives and risks of each were explained to the (patient/caregiver).  Consent for procedure obtained.  Time Out: Verified patient identification, verified procedure, site/side was marked, verified correct patient position, special equipment/implants available, medications/allergies/relevent history reviewed, required imaging and test results available.  Performed  Patient placed on cardiac monitor, pulse oximetry, supplemental oxygen as necessary.  Sedation given:  propofol per anesthesia Pacer pads placed anterior and posterior chest.  Cardioverted 1 time(s).  Cardioverted at 150Jbiphasic.  Impression: Findings: Post procedure EKG shows: Wandering atrial pacemaker Complications: None Patient did tolerate procedure well.  Plan: Successful DCCV with a single 150J biphasic shock. Initial rhythm was sinus, however, then an ectopic atrial rhythm was noted.  Time Spent Directly with the Patient:  30 minutes   [name], MD, [facility], FACP  [place]  [facility]  Medical Director of the Advanced Lipid Disorders &  Cardiovascular Risk Reduction Clinic Diplomate of the American Board of Clinical Lipidology Attending Cardiologist  Direct Dial: [phone]  Fax: [phone]  Website:  [email or web address] Nicely Tacha Manni 05/17/2021, 9:59 AM

## 2021-05-17 NOTE — Interval H&P Note (Signed)
History and Physical Interval Note:  05/17/2021 9:32 AM  Christopher [name]  has presented today for surgery, with the diagnosis of AFIB.  The various methods of treatment have been discussed with the patient and family. After consideration of risks, benefits and other options for treatment, the patient has consented to  Procedure(s): CARDIOVERSION (N/A) as a surgical intervention.  The patient's history has been reviewed, patient examined, no change in status, stable for surgery.  I have reviewed the patient's chart and labs.  Questions were answered to the patient's satisfaction.     [name]

## 2021-05-17 NOTE — Anesthesia Postprocedure Evaluation (Signed)
Anesthesia Post Note  Patient: Christopher [name]  Procedure(s) Performed: CARDIOVERSION     Patient location during evaluation: Endoscopy Anesthesia Type: General Level of consciousness: awake Pain management: pain level controlled Vital Signs Assessment: post-procedure vital signs reviewed and stable Respiratory status: spontaneous breathing Cardiovascular status: stable Postop Assessment: no apparent nausea or vomiting Anesthetic complications: no   No notable events documented.  Last Vitals:  Vitals:   05/17/21 1001 05/17/21 1010  BP: 112/81 (!) 135/108  Pulse: 66 73  Resp: 17 13  Temp:    SpO2: 97% 97%    Last Pain:  Vitals:   05/17/21 1010  TempSrc:   PainSc: 0-No pain                 Deriana Vanderhoef

## 2021-05-20 ENCOUNTER — Encounter (HOSPITAL_COMMUNITY): Payer: Self-pay | Admitting: Internal Medicine

## 2021-05-22 LAB — POCT I-STAT, CHEM 8
BUN: 40 mg/dL — ABNORMAL HIGH (ref 6–20)
Calcium, Ion: 1.11 mmol/L — ABNORMAL LOW (ref 1.15–1.40)
Chloride: 107 mmol/L (ref 98–111)
Creatinine, Ser: 0.9 mg/dL (ref 0.61–1.24)
Glucose, Bld: 100 mg/dL — ABNORMAL HIGH (ref 70–99)
HCT: 54 % — ABNORMAL HIGH (ref 39.0–52.0)
Hemoglobin: 18.4 g/dL — ABNORMAL HIGH (ref 13.0–17.0)
Potassium: 7.5 mmol/L (ref 3.5–5.1)
Sodium: 136 mmol/L (ref 135–145)
TCO2: 27 mmol/L (ref 22–32)

## 2021-06-05 ENCOUNTER — Encounter: Payer: Self-pay | Admitting: Physician Assistant

## 2021-06-05 ENCOUNTER — Other Ambulatory Visit: Payer: Self-pay

## 2021-06-05 ENCOUNTER — Ambulatory Visit (INDEPENDENT_AMBULATORY_CARE_PROVIDER_SITE_OTHER): Payer: No Typology Code available for payment source | Admitting: Physician Assistant

## 2021-06-05 VITALS — BP 110/80 | HR 95 | Ht 66.0 in | Wt 200.8 lb

## 2021-06-05 DIAGNOSIS — I10 Essential (primary) hypertension: Secondary | ICD-10-CM | POA: Diagnosis not present

## 2021-06-05 DIAGNOSIS — G4733 Obstructive sleep apnea (adult) (pediatric): Secondary | ICD-10-CM | POA: Diagnosis not present

## 2021-06-05 DIAGNOSIS — I4819 Other persistent atrial fibrillation: Secondary | ICD-10-CM

## 2021-06-05 DIAGNOSIS — R600 Localized edema: Secondary | ICD-10-CM | POA: Diagnosis not present

## 2021-06-05 MED ORDER — FLECAINIDE ACETATE 50 MG PO TABS: 50.0000 mg | ORAL_TABLET | Freq: Two times a day (BID) | ORAL | 2 refills | Status: DC

## 2021-06-05 NOTE — Progress Notes (Signed)
Cardiology Office Note:    Date:  06/05/2021   ID:  Christopher [name], DOB 1965/11/05, MRN [id number]  PCP:  [name], MD   [facility] Providers Cardiologist:  [name], MD      Referring MD: [name], MD   Chief Complaint:  Hospitalization Follow-up (S/p DCCV for atrial fibrillation )    Patient Profile:    Christopher [name] is a 55 y.o. male with:  Persistent atrial fibrillation S/p DCCV 05/17/2021>>ERAF Hypertension Hyperlipidemia OSA Leg edema  Prior CV studies: Echocardiogram 04/18/2021 EF 55-60, no RWMA, normal RVSF, trivial MR, normal sized atria bilaterally   LONG TERM MONITOR (3-7 DAYS) INTERPRETATION 04/05/2021 Narrative  Patch wear time was 3 days and 1 hour  Patient was in continuous Afib throughout the monitoring period (100% burden) with HR ranging from 62-164 with averag HR 106  Rare ventricular ectopy  No significant pauses  Exercise Tolerance Test 12/05/2016 Narrative  Blood pressure demonstrated a normal response to exercise.  There was no ST segment deviation noted during stress. Good exercise capacity. Normal BP response to exertion. No ischemia.   History of Present Illness: Christopher [name] was last seen in clinic by Dr. Shari Prows in June 2022.  He remained in symptomatic persistent atrial fibrillation.  He has been on anticoagulation without interruption for at least 3 to 4 weeks.  He was set up for outpatient cardioversion which was performed on 05/17/2021.  According to the notes the patient went back into normal sinus rhythm with a wandering atrial pacemaker.  He returns for f/u.  He is here today with his wife.  He felt really good for 2 days post DCCV.  Then he started to feel poorly again with shortness of breath, dizziness, fatigue and chest aching/pressure.  He experienced all of the previously in atrial fibrillation and it resolved in normal sinus rhythm.  He suspected that he was back in atrial fibrillation.  He has  not had syncope, orthopnea. He has leg edema when he is not active.      Past Medical History:  Diagnosis Date   Diverticulitis    Hypercholesteremia    Hypertension    Low back pain    recurrent   OSA (obstructive sleep apnea)    Mild    Current Medications: Current Meds  Medication Sig   amLODipine (NORVASC) 5 MG tablet Take 5 mg by mouth at bedtime.   apixaban (ELIQUIS) 5 MG TABS tablet Take 1 tablet (5 mg total) by mouth 2 (two) times daily.   atorvastatin (LIPITOR) 20 MG tablet Take 20 mg by mouth at bedtime.   Coenzyme Q10 (COQ-10 PO) Take 1 capsule by mouth daily. Qunol   flecainide (TAMBOCOR) 50 MG tablet Take 1 tablet (50 mg total) by mouth 2 (two) times daily.   furosemide (LASIX) 20 MG tablet Take 1 tablet (20 mg total) by mouth daily as needed for fluid or edema.   metoprolol succinate (TOPROL XL) 25 MG 24 hr tablet Take 3 tablets (75 mg total) by mouth daily.   metoprolol tartrate (LOPRESSOR) 25 MG tablet Take 0.5 tablets (12.5 mg total) by mouth 2 (two) times daily as needed (for palpitations).     Allergies:   Patient has no known allergies.   Social History   Tobacco Use   Smoking status: Every Day    Packs/day: 1.00    Types: Cigarettes   Smokeless tobacco: Never  Substance Use Topics   Alcohol use: Yes   Drug use: No  Family Hx: The patient's family history is negative for Heart attack.  Review of Systems  Respiratory:  Positive for sleep disturbances due to breathing and snoring.   Musculoskeletal:  Positive for joint pain (recent torn biceps tendon).  Gastrointestinal:  Negative for hematochezia.  Genitourinary:  Negative for hematuria.    EKGs/Labs/Other Test Reviewed:    EKG:  EKG is   ordered today.  The ekg ordered today demonstrates atrial fibrillation, HR 95, normal axis, QTc 417 ms   Recent Labs: 05/17/2021: BUN 24; Creatinine, Ser 0.90; Hemoglobin 17.3; Potassium 3.9; Sodium 141   Recent Lipid Panel No results found for: CHOL,  TRIG, HDL, LDLCALC, LDLDIRECT    Risk Assessment/Calculations:    CHA2DS2-VASc Score = 1  This indicates a 0.6% annual risk of stroke. The patient's score is based upon: CHF History: No HTN History: Yes Diabetes History: No Stroke History: No Vascular Disease History: No Age Score: 0 Gender Score: 0      Physical Exam:    VS:  BP 110/80   Pulse 95   Ht 5\' 6"  (1.676 m)   Wt 200 lb 12.8 oz (91.1 kg)   SpO2 98%   BMI 32.41 kg/m     Wt Readings from Last 3 Encounters:  06/05/21 200 lb 12.8 oz (91.1 kg)  05/17/21 196 lb (88.9 kg)  04/23/21 199 lb 6.4 oz (90.4 kg)     Constitutional:      Appearance: Healthy appearance. Not in distress.  Neck:     Vascular: No JVR. JVD normal.  Pulmonary:     Effort: Pulmonary effort is normal.     Breath sounds: No wheezing. No rales.  Cardiovascular:     Normal rate. Irregularly irregular rhythm. Normal S1. Normal S2.      Murmurs: There is no murmur.  Edema:    Peripheral edema present.    Ankle: bilateral 1+ edema of the ankle. Abdominal:     Palpations: Abdomen is soft.  Skin:    General: Skin is warm and dry.  Neurological:     General: No focal deficit present.     Mental Status: Alert and oriented to person, place and time.     Cranial Nerves: Cranial nerves are intact.     ASSESSMENT & PLAN:    1. Persistent atrial fibrillation (HCC) He felt much better in normal sinus rhythm for 2 days post DCCV.  He is now back in atrial fibrillation with recurrent symptoms.  I reviewed his case with Dr. 04/25/21 (attending MD, EP) today.  We discussed AAD Rx vs PVI ablation.  As he has persistent atrial fibrillation, we will try starting Flecainide initially.  If he converts on Flecainide, we will need to arrange a f/u ETT-Myoview.    -Continue Apixaban 5 mg once daily   -Continue Metoprolol succinate 75 mg once daily   -Start Flecainide 50 mg twice daily   -F/u in AF Clinic next week  -If he is still in AF, Flecainide can be  increased to 100 mg twice daily with f/u again 1 week later  -If pt remains in AF on Flecainide 100 mg, would proceed with repeat DCCV  -If he remains in normal sinus rhythm, will need a f/u ETT-Myoview scheduled    -Refer to Dr. Johney Frame (EP) in 4-6 weeks; if he has return of AF, PVI ablation can be discussed   2. Essential hypertension BP controlled.  Continue Amlodipine 5 mg once daily, Metoprolol succinate 75 mg once daily.  3. Leg edema He has more edema when he is not active (on the weekends).  I have encouraged him to take prn Furosemide.  He knows to call us if he takes it more than 2 days in a row.   4. OSA (obstructive sleep apnea) He has a hx of mild OSA. He did not meet criteria for CPAP in the past.  His wife has witnessed loud snoring and apnea.  She is able to get a sleep test scheduled through her work (she works at [employer]).  He has an appt with his PCP in a couple of weeks and will discuss at that visit.       Dispo:  Return in about 1 week (around 06/12/2021) for follow up at the AFib Clinic.   Medication Adjustments/Labs and Tests Ordered: Current medicines are reviewed at length with the patient today.  Concerns regarding medicines are outlined above.  Tests Ordered: Orders Placed This Encounter  Procedures   Ambulatory referral to Cardiac Electrophysiology   EKG 12-Lead   Medication Changes: Meds ordered this encounter  Medications   flecainide (TAMBOCOR) 50 MG tablet    Sig: Take 1 tablet (50 mg total) by mouth 2 (two) times daily.    Dispense:  180 tablet    Refill:  2    Signed, [name], PA-C  06/05/2021 4:54 PM    [facility] [address] [place], [place], [place]  [address] Phone: [phone]; Fax: [phone]

## 2021-06-05 NOTE — Patient Instructions (Addendum)
Medication Instructions:  Your physician has recommended you make the following change in your medication:   Begin Flecainide, 50mg  tablet, two times daily.  Labwork: None ordered.  Testing/Procedures: None ordered.  Follow-Up: Your physician recommends that you schedule a follow-up appointment in:   August 18 at 3:30pm with August 20, NP at the [facility]  4-6 weeks with Dr. [name], Electrophysiologist   Afib clinic- Located at [address]tr[address] off [address] across from the [facility] hospital. You may use valet parking or park in the garage under the building.   Garage code for August is [id number] and Sept 4455  Any Other Special Instructions Will Be Listed Below (If Applicable).  Please check with your pharmacist before starting any antibiotics while on flecainide or any antiarrythmic medications.    If you need a refill on your cardiac medications before your next appointment, please call your pharmacy.

## 2021-06-13 ENCOUNTER — Other Ambulatory Visit: Payer: Self-pay

## 2021-06-13 ENCOUNTER — Ambulatory Visit (HOSPITAL_COMMUNITY)
Admission: RE | Admit: 2021-06-13 | Discharge: 2021-06-13 | Disposition: A | Discharge status: Home / Self Care | Payer: No Typology Code available for payment source | Source: Ambulatory Visit | Attending: Nurse Practitioner | Admitting: Nurse Practitioner

## 2021-06-13 ENCOUNTER — Encounter (HOSPITAL_COMMUNITY): Payer: Self-pay | Admitting: Nurse Practitioner

## 2021-06-13 VITALS — BP 98/80 | HR 66 | Ht 66.0 in | Wt 201.4 lb

## 2021-06-13 DIAGNOSIS — I480 Paroxysmal atrial fibrillation: Secondary | ICD-10-CM | POA: Insufficient documentation

## 2021-06-13 DIAGNOSIS — F17210 Nicotine dependence, cigarettes, uncomplicated: Secondary | ICD-10-CM | POA: Insufficient documentation

## 2021-06-13 DIAGNOSIS — G4733 Obstructive sleep apnea (adult) (pediatric): Secondary | ICD-10-CM | POA: Insufficient documentation

## 2021-06-13 DIAGNOSIS — Z7901 Long term (current) use of anticoagulants: Secondary | ICD-10-CM | POA: Insufficient documentation

## 2021-06-13 DIAGNOSIS — I10 Essential (primary) hypertension: Secondary | ICD-10-CM | POA: Insufficient documentation

## 2021-06-13 DIAGNOSIS — Z79899 Other long term (current) drug therapy: Secondary | ICD-10-CM | POA: Insufficient documentation

## 2021-06-13 DIAGNOSIS — D6869 Other thrombophilia: Secondary | ICD-10-CM | POA: Diagnosis not present

## 2021-06-13 DIAGNOSIS — I4891 Unspecified atrial fibrillation: Secondary | ICD-10-CM | POA: Diagnosis present

## 2021-06-13 DIAGNOSIS — R002 Palpitations: Secondary | ICD-10-CM | POA: Diagnosis not present

## 2021-06-13 DIAGNOSIS — E7800 Pure hypercholesterolemia, unspecified: Secondary | ICD-10-CM | POA: Insufficient documentation

## 2021-06-13 MED ORDER — METOPROLOL SUCCINATE ER 25 MG PO TB24: ORAL_TABLET | ORAL | Status: DC

## 2021-06-13 NOTE — Progress Notes (Signed)
Primary Care Physician: [name], MD Referring Physician: [name], PA  Cardiologist: Dr. Freda Munro Trapani is a 55 y.o. male with a h/o HTN that developed  afib in May of this year. He was seen by Dr. Shari Prows and started on anticoagulation and ultimately cardioverted. This was successful and he felt improved for several days. When he returned to see [name], PA, he was back in afib. He consulted Dr. Johney Frame and was started on flecainide 50 mg bid, he was already on BB at 75 mg daily. He is now in the afib clinic to f/u on start of flecainide. He is in SR but has an unusual P wave axis. I discussed with Dr. Johney Frame and he feels he has a low atrial foci. The pt does not feel as well on flecainide, more tired,  so will try to reduce BB. If he does not  tolerate afib, he would like to pursue ablation and  already has a consult set up with Dr. Johney Frame in September. He continues on eliquis 5 mg bid for a CHA2DS2VASc  score of 1. He works 17 hour days from 11 pm to 3pm and drives to work an hour each way. He does snore some and his wife is going to discuss with his pcp with his appointment next week re a sleep study. He had one in the past and it was mild apnea, no CPAP suggested. He does not drink alcohol, he does smoke cigarettes.     Today, he denies symptoms of palpitations, chest pain, shortness of breath, orthopnea, PND, lower extremity edema, dizziness, presyncope, syncope, or neurologic sequela. The patient is tolerating medications without difficulties and is otherwise without complaint today.   Past Medical History:  Diagnosis Date   Diverticulitis    Hypercholesteremia    Hypertension    Low back pain    recurrent   OSA (obstructive sleep apnea)    Mild   Past Surgical History:  Procedure Laterality Date   CARDIOVERSION N/A 05/17/2021   Procedure: CARDIOVERSION;  Surgeon: [name], MD;  Location: [facility];  Service: Cardiovascular;  Laterality:  N/A;   LAPAROSCOPIC SIGMOID COLECTOMY      Current Outpatient Medications  Medication Sig Dispense Refill   amLODipine (NORVASC) 5 MG tablet Take 5 mg by mouth at bedtime.     apixaban (ELIQUIS) 5 MG TABS tablet Take 1 tablet (5 mg total) by mouth 2 (two) times daily. 60 tablet 3   atorvastatin (LIPITOR) 20 MG tablet Take 20 mg by mouth at bedtime.     Coenzyme Q10 (COQ-10 PO) Take 1 capsule by mouth daily. Qunol     flecainide (TAMBOCOR) 50 MG tablet Take 1 tablet (50 mg total) by mouth 2 (two) times daily. 180 tablet 2   furosemide (LASIX) 20 MG tablet Take 1 tablet (20 mg total) by mouth daily as needed for fluid or edema. 30 tablet 3   metoprolol tartrate (LOPRESSOR) 25 MG tablet Take 0.5 tablets (12.5 mg total) by mouth 2 (two) times daily as needed (for palpitations). 30 tablet 2   metoprolol succinate (TOPROL XL) 25 MG 24 hr tablet Take one tablet by mouth twice daily     No current facility-administered medications for this encounter.    No Known Allergies  Social History   Socioeconomic History   Marital status: Married    Spouse name: Not on file   Number of children: Not on file   Years of education: Not  on file   Highest education level: Not on file  Occupational History   Not on file  Tobacco Use   Smoking status: Every Day    Packs/day: 1.00    Types: Cigarettes   Smokeless tobacco: Never   Tobacco comments:    1 pack daily 06/13/21  Substance and Sexual Activity   Alcohol use: Yes    Alcohol/week: 1.0 - 2.0 standard drink    Types: 1 - 2 Glasses of wine per week   Drug use: No   Sexual activity: Not on file  Other Topics Concern   Not on file  Social History Narrative   Not on file   Social Determinants of Health   Financial Resource Strain: Not on file  Food Insecurity: Not on file  Transportation Needs: Not on file  Physical Activity: Not on file  Stress: Not on file  Social Connections: Not on file  Intimate Partner Violence: Not on file     Family History  Problem Relation Age of Onset   Heart attack Neg Hx     ROS- All systems are reviewed and negative except as per the HPI above  Physical Exam: Vitals:   06/13/21 1525  BP: 98/80  Pulse: 66  Weight: 91.4 kg  Height: 5\' 6"  (1.676 m)   Wt Readings from Last 3 Encounters:  06/13/21 91.4 kg  06/05/21 91.1 kg  05/17/21 88.9 kg    Labs: Lab Results  Component Value Date   NA 141 05/17/2021   K 3.9 05/17/2021   CL 106 05/17/2021   CO2 23 10/15/2016   GLUCOSE 98 05/17/2021   BUN 24 (H) 05/17/2021   CREATININE 0.90 05/17/2021   CALCIUM 9.3 10/15/2016   No results found for: INR No results found for: CHOL, HDL, LDLCALC, TRIG   GEN- The patient is well appearing, alert and oriented x 3 today.   Head- normocephalic, atraumatic Eyes-  Sclera clear, conjunctiva pink Ears- hearing intact Oropharynx- clear Neck- supple, no JVP Lymph- no cervical lymphadenopathy Lungs- Clear to ausculation bilaterally, normal work of breathing Heart- Regular rate and rhythm, no murmurs, rubs or gallops, PMI not laterally displaced GI- soft, NT, ND, + BS Extremities- no clubbing, cyanosis, or edema MS- no significant deformity or atrophy Skin- no rash or lesion Psych- euthymic mood, full affect Neuro- strength and sensation are intact  EKG-Unusual P wave axis, at 66 bpm, pr int 176 ms, qrs int 110 ms, qtc 417 ms Reviewed with Dr. 10/17/2016 and ok to continue flecainide     Assessment and Plan:  1. Paroxysmal afib Successful cardioversion 7/22 but with ERAF He is now on flecainide 50 mg bid and in rhythm  He is more tired now on flecainide x one week I will reduce metoprolol to 50 mg from 75 mg daily I will see back in one week to check rhythm and symptoms He is pending a consult with Dr.Allred to discuss ablation  In September  2. CHA2DS2VASc  score of 1 Continue  eliquis 5 mg bid   3. HTN His BP is soft which may be contributing to symptoms  Reducing toprol to  50 mg daily form 75 mg daily hopefully will help this   05-04-1976 C. Lupita Leash [facility] [address] [place], [place] [id number]

## 2021-06-13 NOTE — Patient Instructions (Signed)
Decrease Metoprolol to 50mg - Taking one tablet by mouth twice daily Continue Flecainide 50mg - Taking one tablet by mouth twice daily

## 2021-06-20 ENCOUNTER — Other Ambulatory Visit: Payer: Self-pay

## 2021-06-20 ENCOUNTER — Ambulatory Visit (HOSPITAL_COMMUNITY)
Admission: RE | Admit: 2021-06-20 | Discharge: 2021-06-20 | Disposition: A | Discharge status: Home / Self Care | Payer: No Typology Code available for payment source | Source: Ambulatory Visit | Attending: Nurse Practitioner | Admitting: Nurse Practitioner

## 2021-06-20 ENCOUNTER — Encounter (HOSPITAL_COMMUNITY): Payer: Self-pay | Admitting: Nurse Practitioner

## 2021-06-20 ENCOUNTER — Ambulatory Visit (HOSPITAL_COMMUNITY): Payer: No Typology Code available for payment source | Admitting: Nurse Practitioner

## 2021-06-20 VITALS — BP 136/80 | HR 92 | Ht 66.0 in | Wt 203.4 lb

## 2021-06-20 DIAGNOSIS — Z8249 Family history of ischemic heart disease and other diseases of the circulatory system: Secondary | ICD-10-CM | POA: Insufficient documentation

## 2021-06-20 DIAGNOSIS — M7989 Other specified soft tissue disorders: Secondary | ICD-10-CM | POA: Insufficient documentation

## 2021-06-20 DIAGNOSIS — I10 Essential (primary) hypertension: Secondary | ICD-10-CM | POA: Diagnosis not present

## 2021-06-20 DIAGNOSIS — F17210 Nicotine dependence, cigarettes, uncomplicated: Secondary | ICD-10-CM | POA: Diagnosis not present

## 2021-06-20 DIAGNOSIS — I480 Paroxysmal atrial fibrillation: Secondary | ICD-10-CM | POA: Diagnosis present

## 2021-06-20 DIAGNOSIS — I4892 Unspecified atrial flutter: Secondary | ICD-10-CM | POA: Diagnosis not present

## 2021-06-20 DIAGNOSIS — I483 Typical atrial flutter: Secondary | ICD-10-CM | POA: Diagnosis not present

## 2021-06-20 DIAGNOSIS — Z7901 Long term (current) use of anticoagulants: Secondary | ICD-10-CM | POA: Insufficient documentation

## 2021-06-20 DIAGNOSIS — I4819 Other persistent atrial fibrillation: Secondary | ICD-10-CM

## 2021-06-20 DIAGNOSIS — Z79899 Other long term (current) drug therapy: Secondary | ICD-10-CM | POA: Diagnosis not present

## 2021-06-20 DIAGNOSIS — D6869 Other thrombophilia: Secondary | ICD-10-CM | POA: Diagnosis not present

## 2021-06-20 NOTE — Patient Instructions (Signed)
Increase metoprolol to 37.5mg  twice a day (1 and 1/2 tablets twice a day)  Take a tablet of lasix when you get home today.

## 2021-06-20 NOTE — Progress Notes (Signed)
Primary Care Physician: [name], MD Referring Physician: [name], PA  Cardiologist: Dr. Freda Munro Christopher [name] is a 55 y.o. male with a h/o HTN that developed  afib in May of this year. He was seen by Dr. Shari Christopher [name] and started on anticoagulation and ultimately cardioverted. This was successful and he felt improved for several days. When he returned to see Christopher Newcomer, PA, he was back in afib. He consulted Dr. Johney Christopher [name] and was started on flecainide 50 mg bid, he was already on BB at 75 mg daily. He is now in the afib clinic to f/u on start of flecainide. He is in SR but has an unusual P wave axis. I discussed with Dr. Johney Christopher [name] and he feels he has a low atrial foci. The pt does not feel as well on flecainide, more tired,  so will try to reduce BB. If he does not  tolerate afib, he would like to pursue ablation and  already has a consult set up with Dr. Johney Christopher [name] in September. He continues on eliquis 5 mg bid for a CHA2DS2VASc  score of 1. He works 17 hour days from 11 pm to 3pm and drives to work an hour each way. He does snore some and his wife is going to discuss with his pcp with his appointment next week re a sleep study. He had one in the past and it was mild apnea, no CPAP suggested. He does not drink alcohol, he does smoke cigarettes.   F/u  in afib clinic, 06/20/21. He is in controlled typical atrial flutter today. He is noticing some swelling in hands and feet, weight up 2 lbs here. He has lasix but he does not like to take it. He feels as tired this week with reduction of BB as he did last week. I will go back to his previous dose of BB. Hopefully he will return SR. He will probably require an ablation. He does have appointment with Dr. 9/19 to discuss. He has not taken his am meds yet. He usually takes mid morning.   Today, he denies symptoms of palpitations, chest pain, shortness of breath, orthopnea, PND, lower extremity edema, dizziness, presyncope, syncope, or neurologic sequela.  The patient is tolerating medications without difficulties and is otherwise without complaint today.   Past Medical History:  Diagnosis Date   Diverticulitis    Hypercholesteremia    Hypertension    Low back pain    recurrent   OSA (obstructive sleep apnea)    Mild   Past Surgical History:  Procedure Laterality Date   CARDIOVERSION N/A 05/17/2021   Procedure: CARDIOVERSION;  Surgeon: [name], MD;  Location: [facility];  Service: Cardiovascular;  Laterality: N/A;   LAPAROSCOPIC SIGMOID COLECTOMY      Current Outpatient Medications  Medication Sig Dispense Refill   amLODipine (NORVASC) 5 MG tablet Take 5 mg by mouth at bedtime.     apixaban (ELIQUIS) 5 MG TABS tablet Take 1 tablet (5 mg total) by mouth 2 (two) times daily. 60 tablet 3   atorvastatin (LIPITOR) 20 MG tablet Take 20 mg by mouth at bedtime.     Coenzyme Q10 (COQ-10 PO) Take 1 capsule by mouth daily. Qunol     flecainide (TAMBOCOR) 50 MG tablet Take 1 tablet (50 mg total) by mouth 2 (two) times daily. 180 tablet 2   furosemide (LASIX) 20 MG tablet Take 1 tablet (20 mg total) by mouth daily as needed for fluid or edema. 30 tablet  3   metoprolol succinate (TOPROL XL) 25 MG 24 hr tablet Take one tablet by mouth twice daily     metoprolol tartrate (LOPRESSOR) 25 MG tablet Take 0.5 tablets (12.5 mg total) by mouth 2 (two) times daily as needed (for palpitations). 30 tablet 2   No current facility-administered medications for this encounter.    No Known Allergies  Social History   Socioeconomic History   Marital status: Married    Spouse name: Not on file   Number of children: Not on file   Years of education: Not on file   Highest education level: Not on file  Occupational History   Not on file  Tobacco Use   Smoking status: Every Day    Packs/day: 1.00    Types: Cigarettes   Smokeless tobacco: Never   Tobacco comments:    1 pack daily 06/13/21  Substance and Sexual Activity   Alcohol use: Yes     Alcohol/week: 1.0 - 2.0 standard drink    Types: 1 - 2 Glasses of wine per week   Drug use: No   Sexual activity: Not on file  Other Topics Concern   Not on file  Social History Narrative   Not on file   Social Determinants of Health   Financial Resource Strain: Not on file  Food Insecurity: Not on file  Transportation Needs: Not on file  Physical Activity: Not on file  Stress: Not on file  Social Connections: Not on file  Intimate Partner Violence: Not on file    Family History  Problem Relation Age of Onset   Heart attack Neg Hx     ROS- All systems are reviewed and negative except as per the HPI above  Physical Exam: Vitals:   06/20/21 0827  BP: 136/80  Pulse: 92  Weight: 92.3 kg  Height: 5\' 6"  (1.676 m)   Wt Readings from Last 3 Encounters:  06/20/21 92.3 kg  06/13/21 91.4 kg  06/05/21 91.1 kg    Labs: Lab Results  Component Value Date   NA 141 05/17/2021   K 3.9 05/17/2021   CL 106 05/17/2021   CO2 23 10/15/2016   GLUCOSE 98 05/17/2021   BUN 24 (H) 05/17/2021   CREATININE 0.90 05/17/2021   CALCIUM 9.3 10/15/2016   No results found for: INR No results found for: CHOL, HDL, LDLCALC, TRIG   GEN- The patient is well appearing, alert and oriented x 3 today.   Head- normocephalic, atraumatic Eyes-  Sclera clear, conjunctiva pink Ears- hearing intact Oropharynx- clear Neck- supple, no JVP Lymph- no cervical lymphadenopathy Lungs- Clear to ausculation bilaterally, normal work of breathing Heart- Regular rate and rhythm, no murmurs, rubs or gallops, PMI not laterally displaced GI- soft, NT, ND, + BS Extremities- no clubbing, cyanosis, or edema MS- no significant deformity or atrophy Skin- no rash or lesion Psych- euthymic mood, full affect Neuro- strength and sensation are intact  EKG-Appears to be typical atrial flutter today at 92 bpm    Assessment and Plan:  1. Paroxysmal afib/ typical atrail flutter today  Successful cardioversion 7/22  but with ERAF He is now on flecainide 50 mg bid , was in rythnm last week but now in atrial flutter  He is tired but this did not improve by reducing BB Go back  to metoprolol  75 mg  bid  He was encouraged to use lasix 20 mg to control feeling of swelling in hands and feet as needed  He is pending a  consult with Dr.Allred to discuss ablation   2. CHA2DS2VASc  score of 1 Continue  eliquis 5 mg bid   3. HTN Stable at 136/80 today   F/u with Dr. Johney Christopher [name] 9/19, earlier here if needed   Lupita Leash C. [facility] [address] [place], [place] [phone]

## 2021-06-24 ENCOUNTER — Institutional Professional Consult (permissible substitution): Payer: No Typology Code available for payment source | Admitting: Internal Medicine

## 2021-07-09 ENCOUNTER — Telehealth: Payer: Self-pay

## 2021-07-09 NOTE — Telephone Encounter (Signed)
   [facility] Pre-operative Risk Assessment    Patient Name: Christopher [name]  DOB: June 12, 1966 MRN: [id number]  HEARTCARE STAFF:  - IMPORTANT!!!!!! Under Visit Info/Reason for Call, type in Other and utilize the format Clearance MM/DD/YY or Clearance TBD. Do not use dashes or single digits. - Please review there is not already an duplicate clearance open for this procedure. - If request is for dental extraction, please clarify the # of teeth to be extracted. - If the patient is currently at the dentist's office, call Pre-Op Callback Staff (MA/nurse) to input urgent request.  - If the patient is not currently in the dentist office, please route to the Pre-Op pool.  Request for surgical clearance:  What type of surgery is being performed? Right shoulder scope, Rotator cuff repair   When is this surgery scheduled? TBD  What type of clearance is required (medical clearance vs. Pharmacy clearance to hold med vs. Both)? Medical   Are there any medications that need to be held prior to surgery and how long? None listed  Practice name and name of physician performing surgery? [facility], Dr. Ophelia Charter  What is the office phone number? [place]     7.   What is the office fax number? [phone] Attn: Sherri   8.   Anesthesia type (None, local, MAC, general) ? None listed    Mendel Ryder 07/09/2021, 4:21 PM  _________________________________________________________________   (provider comments below)

## 2021-07-10 NOTE — Telephone Encounter (Signed)
Patient with diagnosis of afib on Eliquis for anticoagulation.    Procedure: Right shoulder scope, Rotator cuff repair  Date of procedure: TBD  CHA2DS2-VASc Score = 1  This indicates a 0.6% annual risk of stroke. The patient's score is based upon: CHF History: 0 HTN History: 1 Diabetes History: 0 Stroke History: 0 Vascular Disease History: 0 Age Score: 0 Gender Score: 0    Underwent DCCV on 05/17/21.  CrCl 109mL/min using adjusted body weight  Per office protocol, patient can hold Eliquis for 2-3 days prior to procedure, however will need to coordinate procedure date with potential ablation. He will need to be on uninterrupted anticoag for 3 weeks before and 3 months after afib ablation/1 month after aflutter ablation depending which rhythm he's in (notes have mentioned both afib and aflutter).

## 2021-07-10 NOTE — Telephone Encounter (Signed)
   Name: Christopher [name]  DOB: [birth year]  MRN: [id number]   Primary Cardiologist: [name], MD  Chart reviewed as part of pre-operative protocol coverage. Patient was contacted 07/10/2021 in reference to pre-operative risk assessment for pending surgery as outlined below.    His wife called stating that they would postpone surgery so he could have the ablation first. I suggested they discuss this with Dr. Johney Frame next week about possibly completing shoulder surgery prior to ablation.  Through shared decision making, Dr. Johney Frame will discuss timing of both proceudres, complete clearance and OAC hold for shoulder surgery.   I will route this recommendation to the requesting party via Epic fax function and remove from pre-op pool. Please call with questions.  [name], PA 07/10/2021, 1:30 PM

## 2021-07-10 NOTE — Telephone Encounter (Signed)
Left VM to evaluate symptoms.   Will send to pharmD to review OAC hold, although not specifically requested.   Pt will see Dr. Johney Frame on 07/15/21 for consideration of ablation. Will need to determine possible ablation date compared to shoulder surgery and timing of OAC hold.

## 2021-07-12 NOTE — Telephone Encounter (Signed)
Pt is scheduled to see Dr. Johney Frame 07/15/2021.  Will route back to preop pool.

## 2021-07-15 ENCOUNTER — Institutional Professional Consult (permissible substitution): Payer: No Typology Code available for payment source | Admitting: Internal Medicine

## 2021-07-15 ENCOUNTER — Ambulatory Visit (INDEPENDENT_AMBULATORY_CARE_PROVIDER_SITE_OTHER): Payer: No Typology Code available for payment source | Admitting: Internal Medicine

## 2021-07-15 ENCOUNTER — Other Ambulatory Visit: Payer: Self-pay

## 2021-07-15 VITALS — BP 110/80 | HR 56 | Ht 66.0 in | Wt 198.8 lb

## 2021-07-15 DIAGNOSIS — I483 Typical atrial flutter: Secondary | ICD-10-CM

## 2021-07-15 DIAGNOSIS — D6869 Other thrombophilia: Secondary | ICD-10-CM

## 2021-07-15 DIAGNOSIS — I4819 Other persistent atrial fibrillation: Secondary | ICD-10-CM | POA: Diagnosis not present

## 2021-07-15 DIAGNOSIS — I10 Essential (primary) hypertension: Secondary | ICD-10-CM

## 2021-07-15 NOTE — Progress Notes (Signed)
Electrophysiology Office Note   Date:  07/15/2021   ID:  Christopher [name], DOB 1966-10-16, MRN [id number]  PCP:  Christopher Small, MD  Cardiologist:  Dr Christopher [name] Primary Electrophysiologist: Christopher Range, MD    CC: afib   History of Present Illness: Christopher [name] is a 55 y.o. male who presents today for electrophysiology evaluation.   He is referred by Christopher [name] and Dr Christopher [name] for EP consultation regarding atrial arrhythmias.   He was initially found to have afib in May.  + fatigue and SOB.  He was started on [facility] and cardioverted.  He returned to afib.  He was placed on flecainide.  He has subsequently developed atrial flutter.   + ongoing concerns about fatigue.  He reports "I am tired all of the time". Today, he denies symptoms of chest pain, shortness of breath, orthopnea, PND, lower extremity edema, claudication, dizziness, presyncope, syncope, bleeding, or neurologic sequela. The patient is tolerating medications without difficulties and is otherwise without complaint today.    Past Medical History:  Diagnosis Date   Diverticulitis    Hypercholesteremia    Hypertension    Low back pain    recurrent   OSA (obstructive sleep apnea)    Mild   Past Surgical History:  Procedure Laterality Date   CARDIOVERSION N/A 05/17/2021   Procedure: CARDIOVERSION;  Surgeon: Christopher Nose, MD;  Location: [facility] ENDOSCOPY;  Service: Cardiovascular;  Laterality: N/A;   LAPAROSCOPIC SIGMOID COLECTOMY       Current Outpatient Medications  Medication Sig Dispense Refill   amLODipine (NORVASC) 5 MG tablet Take 5 mg by mouth at bedtime.     apixaban (ELIQUIS) 5 MG TABS tablet Take 1 tablet (5 mg total) by mouth 2 (two) times daily. 60 tablet 3   atorvastatin (LIPITOR) 20 MG tablet Take 20 mg by mouth at bedtime.     Coenzyme Q10 (COQ-10 PO) Take 1 capsule by mouth daily. Qunol     flecainide (TAMBOCOR) 50 MG tablet Take 1 tablet (50 mg total) by mouth 2 (two) times daily. 180 tablet 2    furosemide (LASIX) 20 MG tablet Take 1 tablet (20 mg total) by mouth daily as needed for fluid or edema. 30 tablet 3   metoprolol succinate (TOPROL-XL) 25 MG 24 hr tablet Take 75 mg by mouth in the morning and at bedtime.     metoprolol tartrate (LOPRESSOR) 25 MG tablet Take 0.5 tablets (12.5 mg total) by mouth 2 (two) times daily as needed (for palpitations). 30 tablet 2   No current facility-administered medications for this visit.    Allergies:   Patient has no known allergies.   Social History:  The patient  reports that he has been smoking cigarettes. He has been smoking an average of 1 pack per day. He has never used smokeless tobacco. He reports current alcohol use of about 1.0 - 2.0 standard drink per week. He reports that he does not use drugs.   Family History:  + HTN  ROS:  Please see the history of present illness.   All other systems are personally reviewed and negative.    PHYSICAL EXAM: VS:  BP 110/80   Pulse (!) 56   Ht 5\' 6"  (1.676 m)   Wt 198 lb 12.8 oz (90.2 kg)   SpO2 97%   BMI 32.09 kg/m  , BMI Body mass index is 32.09 kg/m. GEN: Well nourished, well developed, in no acute distress HEENT: normal Neck: no JVD, carotid bruits, or masses Cardiac:  RRR; no murmurs, rubs, or gallops,no edema  Respiratory:  clear to auscultation bilaterally, normal work of breathing GI: soft, nontender, nondistended, + BS MS: no deformity or atrophy Skin: warm and dry  Neuro:  Strength and sensation are intact Psych: euthymic mood, full affect  EKG:  EKG is ordered today. The ekg ordered today is personally reviewed and shows sinus bradycardia 56 bpm, PR 208 msec   Recent Labs: 05/17/2021: BUN 24; Creatinine, Ser 0.90; Hemoglobin 17.3; Potassium 3.9; Sodium 141  personally reviewed   Lipid Panel  No results found for: CHOL, TRIG, HDL, CHOLHDL, VLDL, LDLCALC, LDLDIRECT personally reviewed   Wt Readings from Last 3 Encounters:  07/15/21 198 lb 12.8 oz (90.2 kg)  06/20/21  203 lb 6.4 oz (92.3 kg)  06/13/21 201 lb 6.4 oz (91.4 kg)      Other studies personally reviewed: Additional studies/ records that were reviewed today include: AF clinic notes,  Christopher [name] notes, prior echo, ecgs  Review of the above records today demonstrates: as abovew   ASSESSMENT AND PLAN:  1.  Persistent atrial fibrillation/ typical atrial flutter The patient has symptomatic, recurrent  atrial arrhythmias. he has failed medical therapy with flecainide.  Given first degree AV block, I am reluctant to increase flecainide dosing Chads2vasc score is 1.  he is anticoagulated with eliquis . Therapeutic strategies for afib including medicine (tikosyn, amiodarone) and ablation were discussed in detail with the patient today. Risk, benefits, and alternatives to EP study and radiofrequency ablation for afib were also discussed in detail today. These risks include but are not limited to stroke, bleeding, vascular damage, tamponade, perforation, damage to the esophagus, lungs, and other structures, pulmonary vein stenosis, worsening renal function, and death. The patient understands these risk and wishes to proceed.  We will therefore proceed with catheter ablation at the next available time.  Carto, ICE, anesthesia are requested for the procedure.  Will also obtain cardiac CT prior to the procedure to exclude LAA thrombus and further evaluate atrial anatomy.  He has ongoing shoulder issues.  He would prefer to have his shoulder surgery prior to ablation.  I think that this is a favorable approach.  He will contact our office after he is seen by ortho so that his ablation can be coordinated once he is back on OAC for at least 3 weeks after shoulder surgery.  2. Preop Ok to proceed with shoulder surgery if medically necessary without any further cardiac testing required.  3. HTN Stable No change required today  4. Mild OSA Reports having a sleep study years ago.  Does not wish to have further  evaluation or management.  Risks, benefits and potential toxicities for medications prescribed and/or refilled reviewed with patient today.       Christopher Idol, MD  07/15/2021 4:08 PM     [facility] [address] [place] [address] [phone] (office) [phone] (fax)

## 2021-07-15 NOTE — Patient Instructions (Signed)
Medication Instructions:  Your physician recommends that you continue on your current medications as directed. Please refer to the Current Medication list given to you today.  Labwork: None ordered.  Testing/Procedures: None ordered.  Follow-Up: Your physician wants you to follow-up in: Call [facility] when you know more information about your ablation timing. [phone]   Any Other Special Instructions Will Be Listed Below (If Applicable).  If you need a refill on your cardiac medications before your next appointment, please call your pharmacy.   Cardiac Ablation Cardiac ablation is a procedure to destroy (ablate) some heart tissue that is sending bad signals. These bad signals cause problems in heart rhythm. The heart has many areas that make these signals. If there are problems in these areas, they can make the heart beat in a way that is not normal. Destroying some tissues can help make the heart rhythm normal. Tell your doctor about: Any allergies you have. All medicines you are taking. These include vitamins, herbs, eye drops, creams, and over-the-counter medicines. Any problems you or family members have had with medicines that make you fall asleep (anesthetics). Any blood disorders you have. Any surgeries you have had. Any medical conditions you have, such as kidney failure. Whether you are pregnant or may be pregnant. What are the risks? This is a safe procedure. But problems may occur, including: Infection. Bruising and bleeding. Bleeding into the chest. Stroke or blood clots. Damage to nearby areas of your body. Allergies to medicines or dyes. The need for a pacemaker if the normal system is damaged. Failure of the procedure to treat the problem. What happens before the procedure? Medicines Ask your doctor about: Changing or stopping your normal medicines. This is important. Taking aspirin and ibuprofen. Do not take these medicines unless your doctor tells you to take  them. Taking other medicines, vitamins, herbs, and supplements. General instructions Follow instructions from your doctor about what you cannot eat or drink. Plan to have someone take you home from the hospital or clinic. If you will be going home right after the procedure, plan to have someone with you for 24 hours. Ask your doctor what steps will be taken to prevent infection. What happens during the procedure?  An IV tube will be put into one of your veins. You will be given a medicine to help you relax. The skin on your neck or groin will be numbed. A cut (incision) will be made in your neck or groin. A needle will be put through your cut and into a large vein. A tube (catheter) will be put into the needle. The tube will be moved to your heart. Dye may be put through the tube. This helps your doctor see your heart. Small devices (electrodes) on the tube will send out signals. A type of energy will be used to destroy some heart tissue. The tube will be taken out. Pressure will be held on your cut. This helps stop bleeding. A bandage will be put over your cut. The exact procedure may vary among doctors and hospitals. What happens after the procedure? You will be watched until you leave the hospital or clinic. This includes checking your heart rate, breathing rate, oxygen, and blood pressure. Your cut will be watched for bleeding. You will need to lie still for a few hours. Do not drive for 24 hours or as long as your doctor tells you. Summary Cardiac ablation is a procedure to destroy some heart tissue. This is done to treat heart rhythm  problems. Tell your doctor about any medical conditions you may have. Tell him or her about all medicines you are taking to treat them. This is a safe procedure. But problems may occur. These include infection, bruising, bleeding, and damage to nearby areas of your body. Follow what your doctor tells you about food and drink. You may also be told to  change or stop some of your medicines. After the procedure, do not drive for 24 hours or as long as your doctor tells you. This information is not intended to replace advice given to you by your health care provider. Make sure you discuss any questions you have with your health care provider. Document Revised: 09/15/2019 Document Reviewed: 09/15/2019 Elsevier Patient Education  2022 [employer].

## 2021-07-16 NOTE — Telephone Encounter (Addendum)
   Patient Name: Christopher [name]  DOB: [birth year] MRN: [id number]  Primary Cardiologist: [name], MD / EP Dr. Johney Frame  Chart reviewed as part of pre-operative protocol coverage. Patient saw Dr. Johney Frame yesterday in clinic at which time clearance was addressed.  Will route his note and this message to requesting surgeon via Epic fax function. Please call with questions.  Laurann Montana, PA-C 07/16/2021, 3:18 PM

## 2021-07-18 DIAGNOSIS — Z01818 Encounter for other preprocedural examination: Secondary | ICD-10-CM

## 2021-07-18 DIAGNOSIS — I4819 Other persistent atrial fibrillation: Secondary | ICD-10-CM

## 2021-07-25 ENCOUNTER — Encounter: Payer: Self-pay | Admitting: *Deleted

## 2021-07-25 NOTE — Telephone Encounter (Signed)
Spoke to the patient's wife, Christopher [name]. Went over CT and ablation instructions that have been sent over [employer].  Verbalized understanding.

## 2021-07-30 ENCOUNTER — Encounter (HOSPITAL_COMMUNITY): Payer: Self-pay

## 2021-08-06 ENCOUNTER — Other Ambulatory Visit: Payer: Self-pay | Admitting: *Deleted

## 2021-08-06 DIAGNOSIS — R002 Palpitations: Secondary | ICD-10-CM

## 2021-08-06 DIAGNOSIS — I4891 Unspecified atrial fibrillation: Secondary | ICD-10-CM

## 2021-08-06 MED ORDER — APIXABAN 5 MG PO TABS: 5.0000 mg | ORAL_TABLET | Freq: Two times a day (BID) | ORAL | 5 refills | Status: DC

## 2021-08-06 NOTE — Telephone Encounter (Signed)
Eliquis 5mg  paper refill request received. Patient is 55 years old, weight-90.2kg, Crea-0.90 on 05/17/2021, Diagnosis-Afib, and last seen by Dr. 05/19/2021 on 07/15/2021. Dose is appropriate based on dosing criteria. Will send in refill to requested pharmacy.

## 2021-08-14 ENCOUNTER — Other Ambulatory Visit: Payer: Self-pay

## 2021-08-14 ENCOUNTER — Encounter (HOSPITAL_BASED_OUTPATIENT_CLINIC_OR_DEPARTMENT_OTHER): Payer: Self-pay | Admitting: Orthopaedic Surgery

## 2021-08-14 NOTE — Progress Notes (Signed)
Patient's chart and recent cardiac notes from Dr Johney Frame reviewed with Dr Hyacinth Meeker, OK to proceed with surgery at [facility].

## 2021-08-15 NOTE — Progress Notes (Signed)
      Enhanced Recovery after Surgery for Orthopedics Enhanced Recovery after Surgery is a protocol used to improve the stress on your body and your recovery after surgery.  Patient Instructions  The night before surgery:  No food after midnight. ONLY clear liquids after midnight  The day of surgery (if you do NOT have diabetes):  Drink ONE (1) Pre-Surgery Clear Ensure as directed.   This drink was given to you during your hospital  pre-op appointment visit. The pre-op nurse will instruct you on the time to drink the  Pre-Surgery Ensure depending on your surgery time. Finish the drink at the designated time by the pre-op nurse.  Nothing else to drink after completing the  Pre-Surgery Clear Ensure.  The day of surgery (if you have diabetes): Drink ONE (1) Gatorade 2 (G2) as directed. This drink was given to you during your hospital  pre-op appointment visit.  The pre-op nurse will instruct you on the time to drink the   Gatorade 2 (G2) depending on your surgery time. Color of the Gatorade may vary. Red is not allowed. Nothing else to drink after completing the  Gatorade 2 (G2).         If you have questions, please contact your surgeon's office. Surgical soap given with instructions, pt verbalized understanding. Benzoyl peroxide gel given with written instructions, pt verbalized understanding.  

## 2021-08-22 ENCOUNTER — Ambulatory Visit (HOSPITAL_BASED_OUTPATIENT_CLINIC_OR_DEPARTMENT_OTHER): Payer: No Typology Code available for payment source | Admitting: Anesthesiology

## 2021-08-22 ENCOUNTER — Other Ambulatory Visit: Payer: Self-pay

## 2021-08-22 ENCOUNTER — Ambulatory Visit (HOSPITAL_BASED_OUTPATIENT_CLINIC_OR_DEPARTMENT_OTHER)
Admission: RE | Admit: 2021-08-22 | Discharge: 2021-08-22 | Disposition: A | Discharge status: Home / Self Care | Payer: No Typology Code available for payment source | Source: Ambulatory Visit | Attending: Orthopaedic Surgery | Admitting: Orthopaedic Surgery

## 2021-08-22 ENCOUNTER — Encounter (HOSPITAL_BASED_OUTPATIENT_CLINIC_OR_DEPARTMENT_OTHER): Payer: Self-pay | Admitting: Orthopaedic Surgery

## 2021-08-22 ENCOUNTER — Encounter (HOSPITAL_BASED_OUTPATIENT_CLINIC_OR_DEPARTMENT_OTHER)
Admission: RE | Disposition: A | Discharge status: Home / Self Care | Payer: Self-pay | Source: Ambulatory Visit | Attending: Orthopaedic Surgery

## 2021-08-22 DIAGNOSIS — I10 Essential (primary) hypertension: Secondary | ICD-10-CM | POA: Insufficient documentation

## 2021-08-22 DIAGNOSIS — M7541 Impingement syndrome of right shoulder: Secondary | ICD-10-CM | POA: Insufficient documentation

## 2021-08-22 DIAGNOSIS — Z79899 Other long term (current) drug therapy: Secondary | ICD-10-CM | POA: Diagnosis not present

## 2021-08-22 DIAGNOSIS — F17210 Nicotine dependence, cigarettes, uncomplicated: Secondary | ICD-10-CM | POA: Diagnosis not present

## 2021-08-22 DIAGNOSIS — M7521 Bicipital tendinitis, right shoulder: Secondary | ICD-10-CM | POA: Insufficient documentation

## 2021-08-22 DIAGNOSIS — M75111 Incomplete rotator cuff tear or rupture of right shoulder, not specified as traumatic: Secondary | ICD-10-CM | POA: Diagnosis not present

## 2021-08-22 DIAGNOSIS — Z7901 Long term (current) use of anticoagulants: Secondary | ICD-10-CM | POA: Diagnosis not present

## 2021-08-22 DIAGNOSIS — I4891 Unspecified atrial fibrillation: Secondary | ICD-10-CM | POA: Diagnosis not present

## 2021-08-22 HISTORY — PX: BICEPT TENODESIS: SHX5116

## 2021-08-22 HISTORY — PX: SHOULDER ARTHROSCOPY WITH SUBACROMIAL DECOMPRESSION: SHX5684

## 2021-08-22 HISTORY — DX: Unspecified atrial fibrillation: I48.91

## 2021-08-22 SURGERY — SHOULDER ARTHROSCOPY WITH SUBACROMIAL DECOMPRESSION
Anesthesia: General | Site: Shoulder | Laterality: Right

## 2021-08-22 MED ORDER — OXYCODONE HCL 5 MG PO TABS: ORAL_TABLET | ORAL | 0 refills | Status: AC

## 2021-08-22 MED ORDER — ACETAMINOPHEN 500 MG PO TABS: 1000.0000 mg | ORAL_TABLET | Freq: Three times a day (TID) | ORAL | 0 refills | Status: AC

## 2021-08-22 MED ORDER — FENTANYL CITRATE (PF) 100 MCG/2ML IJ SOLN
INTRAMUSCULAR | Status: DC | PRN
  Administered 2021-08-22: 100 ug via INTRAVENOUS

## 2021-08-22 MED ORDER — FENTANYL CITRATE (PF) 100 MCG/2ML IJ SOLN
100.0000 ug | Freq: Once | INTRAMUSCULAR | Status: AC
  Administered 2021-08-22: 100 ug via INTRAVENOUS

## 2021-08-22 MED ORDER — PROPOFOL 10 MG/ML IV BOLUS
INTRAVENOUS | Status: DC | PRN
  Administered 2021-08-22: 170 mg via INTRAVENOUS

## 2021-08-22 MED ORDER — FENTANYL CITRATE (PF) 100 MCG/2ML IJ SOLN
INTRAMUSCULAR | Status: AC
  Filled 2021-08-22: qty 2

## 2021-08-22 MED ORDER — PHENYLEPHRINE HCL (PRESSORS) 10 MG/ML IV SOLN
INTRAVENOUS | Status: AC
  Filled 2021-08-22: qty 2

## 2021-08-22 MED ORDER — MIDAZOLAM HCL 2 MG/2ML IJ SOLN
2.0000 mg | Freq: Once | INTRAMUSCULAR | Status: AC
  Administered 2021-08-22: 2 mg via INTRAVENOUS

## 2021-08-22 MED ORDER — IPRATROPIUM-ALBUTEROL 0.5-2.5 (3) MG/3ML IN SOLN
3.0000 mL | Freq: Once | RESPIRATORY_TRACT | Status: AC
  Administered 2021-08-22: 3 mL via RESPIRATORY_TRACT

## 2021-08-22 MED ORDER — VANCOMYCIN HCL 1000 MG IV SOLR
INTRAVENOUS | Status: DC | PRN
  Administered 2021-08-22: 1000 mg via TOPICAL

## 2021-08-22 MED ORDER — ONDANSETRON HCL 4 MG/2ML IJ SOLN
INTRAMUSCULAR | Status: DC | PRN
  Administered 2021-08-22: 4 mg via INTRAVENOUS

## 2021-08-22 MED ORDER — ACETAMINOPHEN 500 MG PO TABS
ORAL_TABLET | ORAL | Status: AC
  Filled 2021-08-22: qty 1

## 2021-08-22 MED ORDER — MIDAZOLAM HCL 2 MG/2ML IJ SOLN
INTRAMUSCULAR | Status: AC
  Filled 2021-08-22: qty 2

## 2021-08-22 MED ORDER — OXYCODONE HCL 5 MG/5ML PO SOLN: 5.0000 mg | Freq: Once | ORAL | Status: DC | PRN

## 2021-08-22 MED ORDER — CEFAZOLIN SODIUM-DEXTROSE 2-4 GM/100ML-% IV SOLN
2.0000 g | INTRAVENOUS | Status: AC
  Administered 2021-08-22: 2 g via INTRAVENOUS

## 2021-08-22 MED ORDER — ACETAMINOPHEN 500 MG PO TABS
1000.0000 mg | ORAL_TABLET | Freq: Once | ORAL | Status: AC
  Administered 2021-08-22: 1000 mg via ORAL

## 2021-08-22 MED ORDER — PHENYLEPHRINE HCL-NACL 20-0.9 MG/250ML-% IV SOLN
INTRAVENOUS | Status: DC | PRN
  Administered 2021-08-22: 50 ug/min via INTRAVENOUS

## 2021-08-22 MED ORDER — ONDANSETRON HCL 4 MG PO TABS: 4.0000 mg | ORAL_TABLET | Freq: Three times a day (TID) | ORAL | 0 refills | Status: AC | PRN

## 2021-08-22 MED ORDER — BUPIVACAINE LIPOSOME 1.3 % IJ SUSP
INTRAMUSCULAR | Status: DC | PRN
  Administered 2021-08-22: 10 mL via PERINEURAL

## 2021-08-22 MED ORDER — MELOXICAM 15 MG PO TABS: 15.0000 mg | ORAL_TABLET | Freq: Every day | ORAL | 0 refills | Status: AC

## 2021-08-22 MED ORDER — CEFAZOLIN SODIUM-DEXTROSE 2-4 GM/100ML-% IV SOLN
INTRAVENOUS | Status: AC
  Filled 2021-08-22: qty 100

## 2021-08-22 MED ORDER — LACTATED RINGERS IV SOLN: INTRAVENOUS | Status: DC

## 2021-08-22 MED ORDER — TRANEXAMIC ACID-NACL 1000-0.7 MG/100ML-% IV SOLN
INTRAVENOUS | Status: AC
  Filled 2021-08-22: qty 100

## 2021-08-22 MED ORDER — OXYCODONE HCL 5 MG PO TABS: 5.0000 mg | ORAL_TABLET | Freq: Once | ORAL | Status: DC | PRN

## 2021-08-22 MED ORDER — LIDOCAINE HCL (CARDIAC) PF 100 MG/5ML IV SOSY
PREFILLED_SYRINGE | INTRAVENOUS | Status: DC | PRN
  Administered 2021-08-22: 100 mg via INTRAVENOUS

## 2021-08-22 MED ORDER — PROMETHAZINE HCL 25 MG/ML IJ SOLN: 6.2500 mg | INTRAMUSCULAR | Status: DC | PRN

## 2021-08-22 MED ORDER — SODIUM CHLORIDE 0.9 % IR SOLN
Status: DC | PRN
  Administered 2021-08-22: 3000 mL

## 2021-08-22 MED ORDER — ROCURONIUM BROMIDE 100 MG/10ML IV SOLN
INTRAVENOUS | Status: DC | PRN
  Administered 2021-08-22: 60 mg via INTRAVENOUS

## 2021-08-22 MED ORDER — FENTANYL CITRATE (PF) 100 MCG/2ML IJ SOLN: 25.0000 ug | INTRAMUSCULAR | Status: DC | PRN

## 2021-08-22 MED ORDER — EPHEDRINE SULFATE 50 MG/ML IJ SOLN
INTRAMUSCULAR | Status: DC | PRN
  Administered 2021-08-22: 15 mg via INTRAVENOUS

## 2021-08-22 MED ORDER — TRANEXAMIC ACID-NACL 1000-0.7 MG/100ML-% IV SOLN
1000.0000 mg | INTRAVENOUS | Status: AC
  Administered 2021-08-22: 1000 mg via INTRAVENOUS

## 2021-08-22 MED ORDER — BUPIVACAINE-EPINEPHRINE (PF) 0.5% -1:200000 IJ SOLN
INTRAMUSCULAR | Status: DC | PRN
  Administered 2021-08-22: 15 mL via PERINEURAL

## 2021-08-22 MED ORDER — SUGAMMADEX SODIUM 200 MG/2ML IV SOLN
INTRAVENOUS | Status: DC | PRN
  Administered 2021-08-22: 200 mg via INTRAVENOUS

## 2021-08-22 MED ORDER — DEXAMETHASONE SODIUM PHOSPHATE 4 MG/ML IJ SOLN
INTRAMUSCULAR | Status: DC | PRN
  Administered 2021-08-22: 4 mg via INTRAVENOUS

## 2021-08-22 MED ORDER — IPRATROPIUM-ALBUTEROL 0.5-2.5 (3) MG/3ML IN SOLN
RESPIRATORY_TRACT | Status: AC
  Filled 2021-08-22: qty 3

## 2021-08-22 SURGICAL SUPPLY — 69 items
AID PSTN UNV HD RSTRNT DISP (MISCELLANEOUS) ×2
APL PRP STRL LF DISP 70% ISPRP (MISCELLANEOUS) ×2
BLADE EXCALIBUR 4.0X13 (MISCELLANEOUS) ×3 IMPLANT
BLADE SURG 10 STRL SS (BLADE) ×3 IMPLANT
BLADE SURG 15 STRL LF DISP TIS (BLADE) ×2 IMPLANT
BLADE SURG 15 STRL SS (BLADE) ×3
BURR OVAL 8 FLU 4.0X13 (MISCELLANEOUS) ×3 IMPLANT
CANNULA 5.75X71 LONG (CANNULA) IMPLANT
CANNULA PASSPORT 5 (CANNULA) IMPLANT
CANNULA PASSPORT BUTTON 10-40 (CANNULA) IMPLANT
CANNULA TWIST IN 8.25X7CM (CANNULA) IMPLANT
CHLORAPREP W/TINT 26 (MISCELLANEOUS) ×3 IMPLANT
CLSR STERI-STRIP ANTIMIC 1/2X4 (GAUZE/BANDAGES/DRESSINGS) ×6 IMPLANT
COOLER ICEMAN CLASSIC (MISCELLANEOUS) ×3 IMPLANT
DRAPE IMP U-DRAPE 54X76 (DRAPES) ×3 IMPLANT
DRAPE INCISE IOBAN 66X45 STRL (DRAPES) IMPLANT
DRAPE SHOULDER BEACH CHAIR (DRAPES) ×3 IMPLANT
DRSG PAD ABDOMINAL 8X10 ST (GAUZE/BANDAGES/DRESSINGS) ×3 IMPLANT
DW OUTFLOW CASSETTE/TUBE SET (MISCELLANEOUS) ×3 IMPLANT
ELECT REM PT RETURN 9FT ADLT (ELECTROSURGICAL) ×3
ELECTRODE REM PT RTRN 9FT ADLT (ELECTROSURGICAL) ×2 IMPLANT
GAUZE SPONGE 4X4 12PLY STRL (GAUZE/BANDAGES/DRESSINGS) ×3 IMPLANT
GLOVE SRG 8 PF TXTR STRL LF DI (GLOVE) ×2 IMPLANT
GLOVE SURG ENC MOIS LTX SZ6.5 (GLOVE) ×6 IMPLANT
GLOVE SURG LTX SZ8 (GLOVE) ×3 IMPLANT
GLOVE SURG UNDER POLY LF SZ6.5 (GLOVE) ×3 IMPLANT
GLOVE SURG UNDER POLY LF SZ7 (GLOVE) ×6 IMPLANT
GLOVE SURG UNDER POLY LF SZ8 (GLOVE) ×3
GOWN STRL REUS W/ TWL LRG LVL3 (GOWN DISPOSABLE) ×4 IMPLANT
GOWN STRL REUS W/TWL LRG LVL3 (GOWN DISPOSABLE) ×6
GOWN STRL REUS W/TWL XL LVL3 (GOWN DISPOSABLE) ×3 IMPLANT
IV NS IRRIG 3000ML ARTHROMATIC (IV SOLUTION) ×6 IMPLANT
KIT SHOULDER STAB MARCO (KITS) ×3 IMPLANT
KIT STABILIZATION SHOULDER (MISCELLANEOUS) IMPLANT
KIT STR SPEAR 1.8 FBRTK DISP (KITS) IMPLANT
LASSO CRESCENT QUICKPASS (SUTURE) IMPLANT
MANIFOLD NEPTUNE II (INSTRUMENTS) ×3 IMPLANT
NDL SAFETY ECLIPSE 18X1.5 (NEEDLE) ×2 IMPLANT
NEEDLE HYPO 18GX1.5 SHARP (NEEDLE) ×3
NEEDLE SCORPION MULTI FIRE (NEEDLE) IMPLANT
PACK ARTHROSCOPY DSU (CUSTOM PROCEDURE TRAY) ×3 IMPLANT
PACK BASIN DAY SURGERY FS (CUSTOM PROCEDURE TRAY) ×3 IMPLANT
PAD COLD SHLDR WRAP-ON (PAD) ×3 IMPLANT
PENCIL SMOKE EVACUATOR (MISCELLANEOUS) ×3 IMPLANT
PORT APPOLLO RF 90DEGREE MULTI (SURGICAL WAND) ×3 IMPLANT
RESTRAINT HEAD UNIVERSAL NS (MISCELLANEOUS) ×3 IMPLANT
SHEET MEDIUM DRAPE 40X70 STRL (DRAPES) IMPLANT
SLEEVE SCD COMPRESS KNEE MED (STOCKING) ×3 IMPLANT
SLING ARM FOAM STRAP LRG (SOFTGOODS) IMPLANT
SPONGE T-LAP 4X18 ~~LOC~~+RFID (SPONGE) ×6 IMPLANT
SUT FIBERWIRE #2 38 T-5 BLUE (SUTURE)
SUT MNCRL AB 4-0 PS2 18 (SUTURE) ×3 IMPLANT
SUT PDS AB 1 CT  36 (SUTURE)
SUT PDS AB 1 CT 36 (SUTURE) IMPLANT
SUT TIGER TAPE 7 IN WHITE (SUTURE) IMPLANT
SUT VIC AB 0 CT1 27 (SUTURE) ×3
SUT VIC AB 0 CT1 27XCR 8 STRN (SUTURE) ×2 IMPLANT
SUT VIC AB 3-0 SH 27 (SUTURE) ×3
SUT VIC AB 3-0 SH 27X BRD (SUTURE) ×2 IMPLANT
SUTURE FIBERWR #2 38 T-5 BLUE (SUTURE) IMPLANT
SUTURE TAPE TIGERLINK 1.3MM BL (SUTURE) IMPLANT
SUTURETAPE TIGERLINK 1.3MM BL (SUTURE)
SYR 5ML LL (SYRINGE) ×3 IMPLANT
SYSTEM TENDODESIS IMPLANT (Miscellaneous) ×3 IMPLANT
TAPE FIBER 2MM 7IN #2 BLUE (SUTURE) IMPLANT
TOWEL GREEN STERILE FF (TOWEL DISPOSABLE) ×6 IMPLANT
TUBE CONNECTING 20X1/4 (TUBING) ×3 IMPLANT
TUBE SUCTION HIGH CAP CLEAR NV (SUCTIONS) ×3 IMPLANT
TUBING ARTHROSCOPY IRRIG 16FT (MISCELLANEOUS) ×3 IMPLANT

## 2021-08-22 NOTE — Transfer of Care (Signed)
Immediate Anesthesia Transfer of Care Note  Patient: Christopher [name]  Procedure(s) Performed: SHOULDER ARTHROSCOPY WITH SUBACROMIAL DECOMPRESSION (Right: Shoulder) OPEN BICEPS TENODESIS (Right: Shoulder)  Patient Location: PACU  Anesthesia Type:General  Level of Consciousness: drowsy and patient cooperative  Airway & Oxygen Therapy: Patient Spontanous Breathing and Patient connected to face mask oxygen  Post-op Assessment: Report given to RN and Post -op Vital signs reviewed and stable  Post vital signs: Reviewed and stable  Last Vitals:  Vitals Value Taken Time  BP    Temp    Pulse    Resp    SpO2      Last Pain:  Vitals:   08/22/21 0759  TempSrc: Oral  PainSc: 0-No pain         Complications: No notable events documented.

## 2021-08-22 NOTE — Anesthesia Preprocedure Evaluation (Addendum)
Anesthesia Evaluation  Patient identified by MRN, date of birth, ID band Patient awake    Reviewed: Allergy & Precautions, NPO status , Patient's Chart, lab work & pertinent test results, reviewed documented beta blocker date and time   History of Anesthesia Complications Negative for: history of anesthetic complications  Airway Mallampati: II  TM Distance: >3 FB Neck ROM: Full    Dental no notable dental hx.    Pulmonary sleep apnea , Current Smoker,    Pulmonary exam normal        Cardiovascular hypertension, Pt. on medications and Pt. on home beta blockers Normal cardiovascular exam+ dysrhythmias (on Eliquis and flecainide) Atrial Fibrillation   TTE 03/2021: EF 55-60%, valves ok   Neuro/Psych negative neurological ROS  negative psych ROS   GI/Hepatic negative GI ROS, Neg liver ROS,   Endo/Other  negative endocrine ROS  Renal/GU negative Renal ROS  negative genitourinary   Musculoskeletal negative musculoskeletal ROS (+)   Abdominal   Peds  Hematology negative hematology ROS (+)   Anesthesia Other Findings Day of surgery medications reviewed with patient.  Reproductive/Obstetrics negative OB ROS                            Anesthesia Physical Anesthesia Plan  ASA: 2  Anesthesia Plan: General   Post-op Pain Management:    Induction: Intravenous  PONV Risk Score and Plan: 2 and Treatment may vary due to age or medical condition, Ondansetron, Dexamethasone and Midazolam  Airway Management Planned: Oral ETT  Additional Equipment: None  Intra-op Plan:   Post-operative Plan: Extubation in OR  Informed Consent: I have reviewed the patients History and Physical, chart, labs and discussed the procedure including the risks, benefits and alternatives for the proposed anesthesia with the patient or authorized representative who has indicated his/her understanding and acceptance.      Dental advisory given  Plan Discussed with: CRNA  Anesthesia Plan Comments:        Anesthesia Quick Evaluation

## 2021-08-22 NOTE — Progress Notes (Signed)
Assisted Dr. Howze with right, ultrasound guided, interscalene  block. Side rails up, monitors on throughout procedure. See vital signs in flow sheet. Tolerated Procedure well. °

## 2021-08-22 NOTE — Discharge Instructions (Addendum)
Ramond Marrow MD, MPH Alfonse Alpers, PA-C [facility] [address], [address] [phone] (tel)   [phone] (fax)   POST-OPERATIVE INSTRUCTIONS - SHOULDER ARTHROSCOPY  WOUND CARE You may remove the Operative Dressing on Post-Op Day #3 (72hrs after surgery).   Alternatively if you would like you can leave dressing on until follow-up if within 7-8 days but keep it dry. Leave steri-strips in place until they fall off on their own, usually 2 weeks postop. There may be a small amount of fluid/bleeding leaking at the surgical site.  This is normal; the shoulder is filled with fluid during the procedure and can leak for 24-48hrs after surgery.  You may change/reinforce the bandage as needed.  Use the Cryocuff or Ice as often as possible for the first 7 days, then as needed for pain relief. Always keep a towel, ACE wrap or other barrier between the cooling unit and your skin.  You may shower on Post-Op Day #3. Gently pat the area dry. Do not soak the shoulder in water or submerge it. Keep incisions as dry as possible. Do not go swimming in the pool or ocean until 4 weeks after surgery or when otherwise instructed.    EXERCISES/BRACING Sling should be used at all times until follow-up.  You can remove sling for hygiene.    Please continue to ambulate and do not stay sitting or lying for too long. Perform foot and wrist pumps to assist in circulation.  POST-OP MEDICATIONS- Multimodal approach to pain control In general your pain will be controlled with a combination of substances.  Prescriptions unless otherwise discussed are electronically sent to your pharmacy.  This is a carefully made plan we use to minimize narcotic use.     Meloxicam - Anti-inflammatory medication taken on a scheduled basis Acetaminophen - Non-narcotic pain medicine taken on a scheduled basis  Oxycodone - This is a strong narcotic, to be used only on an "as needed" basis for SEVERE pain. Zofran -  take as needed for nausea  You may restart your Eliquis 24 hours after surgery  FOLLOW-UP If you develop a Fever (?101.5), Redness or Drainage from the surgical incision site, please call our office to arrange for an evaluation. Please call the office to schedule a follow-up appointment for your suture removal, 10-14 days post-operatively.    HELPFUL INFORMATION  If you had a block, it will wear off between 8-24 hrs postop typically.  This is period when your pain may go from nearly zero to the pain you would have had postop without the block.  This is an abrupt transition but nothing dangerous is happening.  You may take an extra dose of narcotic when this happens.  You may be more comfortable sleeping in a semi-seated position the first few nights following surgery.  Keep a pillow propped under the elbow and forearm for comfort.  If you have a recliner type of chair it might be beneficial.  If not that is fine too, but it would be helpful to sleep propped up with pillows behind your operated shoulder as well under your elbow and forearm.  This will reduce pulling on the suture lines.  When dressing, put your operative arm in the sleeve first.  When getting undressed, take your operative arm out last.  Loose fitting, button-down shirts are recommended.  Often in the first days after surgery you may be more comfortable keeping your operative arm under your shirt and not through the sleeve.  You  may return to work/school in the next couple of days when you feel up to it.  Desk work and typing in the sling is fine.  We suggest you use the pain medication the first night prior to going to bed, in order to ease any pain when the anesthesia wears off. You should avoid taking pain medications on an empty stomach as it will make you nauseous.  You should wean off your narcotic medicines as soon as you are able.  Most patients will be off or using minimal narcotics before their first postop  appointment.   Do not drink alcoholic beverages or take illicit drugs when taking pain medications.  It is against the law to drive while taking narcotics.  In some states it is against the law to drive while your arm is in a sling.   Pain medication may make you constipated.  Below are a few solutions to try in this order: Decrease the amount of pain medication if you aren't having pain. Drink lots of decaffeinated fluids. Drink prune juice and/or eat dried prunes  If the first 3 don't work start with additional solutions Take Colace - an over-the-counter stool softener Take Senokot - an over-the-counter laxative Take Miralax - a stronger over-the-counter laxative  For more information including helpful videos and documents visit our website:   [email or web address]      Post Anesthesia Home Care Instructions  Activity: Get plenty of rest for the remainder of the day. A responsible individual must stay with you for 24 hours following the procedure.  For the next 24 hours, DO NOT: -Drive a car -Advertising copywriter -Drink alcoholic beverages -Take any medication unless instructed by your physician -Make any legal decisions or sign important papers.  Meals: Start with liquid foods such as gelatin or soup. Progress to regular foods as tolerated. Avoid greasy, spicy, heavy foods. If nausea and/or vomiting occur, drink only clear liquids until the nausea and/or vomiting subsides. Call your physician if vomiting continues.  Special Instructions/Symptoms: Your throat may feel dry or sore from the anesthesia or the breathing tube placed in your throat during surgery. If this causes discomfort, gargle with warm salt water. The discomfort should disappear within 24 hours.  If you had a scopolamine patch placed behind your ear for the management of post- operative nausea and/or vomiting:  1. The medication in the patch is effective for 72 hours, after  which it should be removed.  Wrap patch in a tissue and discard in the trash. Wash hands thoroughly with soap and water. 2. You may remove the patch earlier than 72 hours if you experience unpleasant side effects which may include dry mouth, dizziness or visual disturbances. 3. Avoid touching the patch. Wash your hands with soap and water after contact with the patch.  Regional Anesthesia Blocks  1. Numbness or the inability to move the "blocked" extremity may last from 3-48 hours after placement. The length of time depends on the medication injected and your individual response to the medication. If the numbness is not going away after 48 hours, call your surgeon.  2. The extremity that is blocked will need to be protected until the numbness is gone and the  Strength has returned. Because you cannot feel it, you will need to take extra care to avoid injury. Because it may be weak, you may have difficulty moving it or using it. You may not know what position it is in without looking at it while the block is in  effect.  3. For blocks in the legs and feet, returning to weight bearing and walking needs to be done carefully. You will need to wait until the numbness is entirely gone and the strength has returned. You should be able to move your leg and foot normally before you try and bear weight or walk. You will need someone to be with you when you first try to ensure you do not fall and possibly risk injury.  4. Bruising and tenderness at the needle site are common side effects and will resolve in a few days.  5. Persistent numbness or new problems with movement should be communicated to the surgeon or the [facility] [phone] [facility] [phone]).   Information for Discharge Teaching: EXPAREL (bupivacaine liposome injectable suspension)   Your surgeon or anesthesiologist gave you EXPAREL(bupivacaine) to help control your pain after surgery.  EXPAREL is a local  anesthetic that provides pain relief by numbing the tissue around the surgical site. EXPAREL is designed to release pain medication over time and can control pain for up to 72 hours. Depending on how you respond to EXPAREL, you may require less pain medication during your recovery.  Possible side effects: Temporary loss of sensation or ability to move in the area where bupivacaine was injected. Nausea, vomiting, constipation Rarely, numbness and tingling in your mouth or lips, lightheadedness, or anxiety may occur. Call your doctor right away if you think you may be experiencing any of these sensations, or if you have other questions regarding possible side effects.  Follow all other discharge instructions given to you by your surgeon or nurse. Eat a healthy diet and drink plenty of water or other fluids.  If you return to the hospital for any reason within 96 hours following the administration of EXPAREL, it is important for health care providers to know that you have received this anesthetic. A teal colored band has been placed on your arm with the date, time and amount of EXPAREL you have received in order to alert and inform your health care providers. Please leave this armband in place for the full 96 hours following administration, and then you may remove the band.       Next dose of Tylenol can be given at 2:15pm if needed.

## 2021-08-22 NOTE — Anesthesia Postprocedure Evaluation (Signed)
Anesthesia Post Note  Patient: Christopher [name]  Procedure(s) Performed: SHOULDER ARTHROSCOPY WITH SUBACROMIAL DECOMPRESSION (Right: Shoulder) OPEN BICEPS TENODESIS (Right: Shoulder)     Patient location during evaluation: PACU Anesthesia Type: General Level of consciousness: awake and alert and oriented Pain management: pain level controlled Vital Signs Assessment: post-procedure vital signs reviewed and stable Respiratory status: spontaneous breathing, nonlabored ventilation and respiratory function stable Cardiovascular status: blood pressure returned to baseline Postop Assessment: no apparent nausea or vomiting Anesthetic complications: no   No notable events documented.  Last Vitals:  Vitals:   08/22/21 1145 08/22/21 1154  BP: 105/72   Pulse: 64   Resp: (!) 21   Temp:    SpO2: 94% 93%    Last Pain:  Vitals:   08/22/21 1154  TempSrc:   PainSc: 0-No pain                 Shanda Howells

## 2021-08-22 NOTE — Op Note (Signed)
Orthopaedic Surgery Operative Note (CSN: [id number])  Christopher [name]  05/27/1966 Date of Surgery: 08/22/2021   Diagnoses:  Right shoulder proximal biceps rupture with impingement and concern for acute rotator cuff tear  Procedure: Arthroscopic extensive debridement - Debrided areas: Biceps stump, labrum, bursa and cuff Open subpectoral biceps tenodesis Subacromial decompression arthroscopic   Operative Finding Exam under anesthesia: Full motion no limitation no instability Articular space: No loose bodies, capsule intact, anterior and superior labral fraying with stump of biceps present in the joint Chondral surfaces:Intact, no sign of chondral degeneration on the glenoid or humeral head Biceps: Complete rupture with a stump left on the top of the labrum Subscapularis: Normal Superior Cuff: Partial articular sided tear about 3 to 5% debrided back Bursal side: Normal, mild wear pattern on the CA ligament.  Type II acromion converted to type I  Successful completion of the planned procedure.  Relatively routine case.  The biceps had partially scarred down in a shortened position.  We able to free this and performing robust subpectoral biceps tenodesis.   Post-operative plan: The patient will be non-weightbearing in a sling for 4 weeks with therapy to start immediately.  The patient will be discharged home.  DVT prophylaxis not as patient on Eliquis at baseline.   Pain control with PRN pain medication preferring oral medicines.  Follow up plan will be scheduled in approximately 7 days for incision check and XR.  Post-Op Diagnosis: Same Surgeons:Primary: [name], MD Assistants:Caroline McBane PA-C Location: [facility] OR ROOM 6 Anesthesia: General with Exparel interscalene block Antibiotics: Ancef 2 g with local vancomycin powder 1 g at the surgical site Tourniquet time: None Estimated Blood Loss: Minimal Complications: None Specimens: None Implants: Implant Name Type Inv. Item  Serial No. Manufacturer Lot No. LRB No. Used Action  SYSTEM TENDODESIS IMPLANT - [id number] Miscellaneous SYSTEM [employer] [id number] Right 1 Implanted    Indications for Surgery:   Christopher [name] is a 55 y.o. male with continued shoulder pain refractory to nonoperative measures for extended period of time.  Patient's MRI and clinical exam are consistent with a proximal biceps rupture and possible subscapularis tear.  The risks and benefits were explained at length including but not limited to continued pain, cuff failure, biceps tenodesis failure, stiffness, need for further surgery and infection.   Procedure:   Patient was correctly identified in the preoperative holding area and operative site marked.  Patient brought to OR and positioned beachchair on an Uncertain table ensuring that all bony prominences were padded and the head was in an appropriate location.  Anesthesia was induced and the operative shoulder was prepped and draped in the usual sterile fashion.  Timeout was called preincision.  A standard posterior viewing portal was made after localizing the portal with a spinal needle.  An anterior accessory portal was also made.  After clearing the articular space the camera was positioned in the subacromial space.  Findings above.    Extensive debridement was performed of the anterior interval tissue, labral fraying and the bursa.  Subacromial decompression: We made a lateral portal with spinal needle guidance. We then proceeded to debride bursal tissue extensively with a shaver and arthrocare device. At that point we continued to identify the borders of the acromion and identify the spur. We then carefully preserved the deltoid fascia and used a burr to convert the acromion to a Type 1 flat acromion without issue.  We turned our attention to the biceps tenodesis portion of the case.  A longitudinal incision was made just distal to the pectoralis major insertion on the humerus  in line with the biceps groove. We incised the skin and carefully dissected down to the level of the bicipital fascia. Fascia was identified and incised with a knife. Fasciotomy the length of our 2 cm incision. At this point angle palpation was used to identify the bicipital tendon that had been tenotomized that was residing in the bicipital groove. It was extracted from the incision and the 2 cm from the musculotendinous junction the tendon was whipstitched towards the musculotendinous junction and the remaining tendon was sharply excised. We then turned our attention to placement of retractors. A Hohmann retractor was placed over the lateral aspect of the humerus carefully the arm in 60 of abduction. At this point we used a curved Mayo scissor to carefully dissect around the medial aspect humerus and placed a Hohmann retractor under direct visualization avoiding the neurovascular bundle. These were held carefully in place while the periosteum was cleared from the bicipital groove at the superior aspect of our incision.  We used a Arthrex biceps tenodesis onlay button.  We made a 3.75 mm hole with a spade tipped guidewire and passed our sutures from our whipstitch biceps into the button itself.  These were #2 FiberWire.  We then were able to insert the button into the unicortical hole and obtain good purchase.  We passed the limb of the suture back through the tendon and performed alternating half hitches compressing tendon to bone.  Final construct was stable with elbow motion.  Incision was thoroughly irrigated.  The incisions were closed with absorbable monocryl and steri strips.  A sterile dressing was placed along with a sling. The patient was awoken from general anesthesia and taken to the PACU in stable condition without complication.   Alfonse Alpers, PA-C, present and scrubbed throughout the case, critical for completion in a timely fashion, and for retraction, instrumentation, closure.

## 2021-08-22 NOTE — Interval H&P Note (Signed)
All questions answered, patient wants to proceed with procedure. ? ?

## 2021-08-22 NOTE — Anesthesia Procedure Notes (Signed)
Procedure Name: Intubation Date/Time: 08/22/2021 10:01 AM Performed by: Thornell Mule, CRNA Pre-anesthesia Checklist: Patient identified, Emergency Drugs available, Suction available and Patient being monitored Patient Re-evaluated:Patient Re-evaluated prior to induction Oxygen Delivery Method: Circle system utilized Preoxygenation: Pre-oxygenation with 100% oxygen Induction Type: IV induction Ventilation: Mask ventilation without difficulty Laryngoscope Size: Miller and 3 Grade View: Grade I Tube type: Oral Tube size: 7.5 mm Number of attempts: 1 Airway Equipment and Method: Stylet and Oral airway Placement Confirmation: ETT inserted through vocal cords under direct vision, positive ETCO2 and breath sounds checked- equal and bilateral Secured at: 21 cm Tube secured with: Tape Dental Injury: Teeth and Oropharynx as per pre-operative assessment

## 2021-08-22 NOTE — H&P (Signed)
PREOPERATIVE H&P  Chief Complaint: right shoulder cartilage disorder, impingement syndrome,rotator cuff repair,bicep tendinitis  HPI: Christopher [name] is a 55 y.o. male who is scheduled for, Procedure(s): SHOULDER ARTHROSCOPY WITH SUBACROMIAL DECOMPRESSION, ROTATOR CUFF REPAIR AND BICEP TENDON REPAIR.   Patient has a past medical history significant for atrial fibrillation and HTN.   Patient is a 55 year-old who has had a history of a Popeye deformity 2-3 months ago when he was picking up a heavy item.  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  He is left hand dominant at baseline.    His symptoms are rated as moderate to severe, and have been worsening.  This is significantly impairing activities of daily living.    Please see clinic note for further details on this patient's care.    He has elected for surgical management.   Past Medical History:  Diagnosis Date   A-fib (HCC)    on Eliquis, flecainide and metoprolol   Diverticulitis    Hypercholesteremia    Hypertension    Low back pain    recurrent   OSA (obstructive sleep apnea)    Mild   Past Surgical History:  Procedure Laterality Date   CARDIOVERSION N/A 05/17/2021   Procedure: CARDIOVERSION;  Surgeon: [name], MD;  Location: [facility] END[facility]COPY;  Service: Cardiovascular;  Laterality: N/A;   LAPAROSCOPIC SIGMOID COLECTOMY     Social History   Socioeconomic History   Marital status: Married    Spouse name: Not on file   Number of children: Not on file   Years of education: Not on file   Highest education level: Not on file  Occupational History   Not on file  Tobacco Use   Smoking status: Every Day    Packs/day: 1.00    Types: Cigarettes   Smokeless tobacco: Never   Tobacco comments:    Smokes 3/4-1ppd  Substance and Sexual Activity   Alcohol use: Yes    Alcohol/week: 1.0 - 2.0 standard drink    Types: 1 - 2 Glasses of wine per week    Comment: SOCIAL   Drug use: No   Sexual  activity: Yes  Other Topics Concern   Not on file  Social History Narrative   Not on file   Social Determinants of Health   Financial Resource Strain: Not on file  Food Insecurity: Not on file  Transportation Needs: Not on file  Physical Activity: Not on file  Stress: Not on file  Social Connections: Not on file   Family History  Problem Relation Age of Onset   Heart attack Neg Hx    No Known Allergies Prior to Admission medications   Medication Sig Start Date End Date Taking? Authorizing Provider  amLODipine (NORVASC) 5 MG tablet Take 5 mg by mouth at bedtime.   Yes [provider]  apixaban (ELIQUIS) 5 MG TABS tablet Take 1 tablet (5 mg total) by mouth 2 (two) times daily. 08/06/21  Yes Allred, Fayrene Fearing, MD  atorvastatin (LIPITOR) 20 MG tablet Take 20 mg by mouth at bedtime.   Yes [provider]  Coenzyme Q10 (COQ-10 PO) Take 1 capsule by mouth daily. Qunol   Yes [provider]  flecainide (TAMBOCOR) 50 MG tablet Take 1 tablet (50 mg total) by mouth 2 (two) times daily. 06/05/21  Yes Weaver, Scott T, PA-C  metoprolol succinate (TOPROL-XL) 25 MG 24 hr tablet Take by mouth in the morning and at bedtime. PATIENT TAKES 1  1/2 TABS TWICE DAILY   Yes [provider]  furosemide (LASIX) 20 MG tablet Take 1 tablet (20 mg total) by mouth daily as needed for fluid or edema. 03/19/21   [name], MD  metoprolol tartrate (LOPRESSOR) 25 MG tablet Take 0.5 tablets (12.5 mg total) by mouth 2 (two) times daily as needed (for palpitations). 03/19/21 06/20/21  [name], MD    ROS: All other systems have been reviewed and were otherwise negative with the exception of those mentioned in the HPI and as above.  Physical Exam: General: Alert, no acute distress Cardiovascular: No pedal edema Respiratory: No cyanosis, no use of accessory musculature GI: No organomegaly, abdomen is soft and non-tender Skin: No lesions in the area of chief  complaint Neurologic: Sensation intact distally Psychiatric: Patient is competent for consent with normal mood and affect Lymphatic: No axillary or cervical lymphadenopathy  MUSCULOSKELETAL:  Examination of the right shoulder demonstrates active forward elevation of 160, passive to 170.  External rotation to 30 with pain.  4/5 subscapularis.  Strength testing 5-/5 supraspinatus testing.  Negative AC tenderness to palpation.  Normal O'Brien's.  Positive impingement.    Imaging: MRI demonstrates a partial thickness 50-70% articular side of the subscapularis tear.  He has a proximal biceps rupture as well.  There is some fluid around the subacromial space.  Type II acromion.    Assessment: right shoulder cartilage disorder, impingement syndrome,rotator cuff repair,bicep tendinitis  Plan: Plan for Procedure(s): SHOULDER ARTHROSCOPY WITH SUBACROMIAL DECOMPRESSION, ROTATOR CUFF REPAIR AND BICEP TENDON REPAIR  The risks benefits and alternatives were discussed with the patient including but not limited to the risks of nonoperative treatment, versus surgical intervention including infection, bleeding, nerve injury,  blood clots, cardiopulmonary complications, morbidity, mortality, among others, and they were willing to proceed.   The patient acknowledged the explanation, agreed to proceed with the plan and consent was signed.   Operative Plan: Right shoulder scope with arthroscopic cuff repair and possible subpectoral open biceps tenodesis with a subacromial decompression.   Discharge Medications: Standard DVT Prophylaxis: Resume Eliquis Physical Therapy: Outpatient PT Special Discharge needs: Sling. IceMan   Vernetta Honey, PA-C  08/22/2021 5:14 AM

## 2021-08-22 NOTE — Anesthesia Procedure Notes (Signed)
Anesthesia Regional Block: Interscalene brachial plexus block   Pre-Anesthetic Checklist: , timeout performed,  Correct Patient, Correct Site, Correct Laterality,  Correct Procedure, Correct Position, site marked,  Risks and benefits discussed,  Pre-op evaluation,  At surgeon's request and post-op pain management  Laterality: Right  Prep: Maximum Sterile Barrier Precautions used, chloraprep       Needles:  Injection technique: Single-shot  Needle Type: Echogenic Stimulator Needle     Needle Length: 9cm  Needle Gauge: 22     Additional Needles:   Procedures:,,,, ultrasound used (permanent image in chart),,    Narrative:  Start time: 08/22/2021 9:17 AM End time: 08/22/2021 9:20 AM Injection made incrementally with aspirations every 5 mL.  Performed by: Personally  Anesthesiologist: Kaylyn Layer, MD  Additional Notes: Risks, benefits, and alternative discussed. Patient gave consent for procedure. Patient prepped and draped in sterile fashion. Sedation administered, patient remains easily responsive to voice. Relevant anatomy identified with ultrasound guidance. Local anesthetic given in 5cc increments with no signs or symptoms of intravascular injection. No pain or paraesthesias with injection. Patient monitored throughout procedure with signs of LAST or immediate complications. Tolerated well. Ultrasound image placed in chart.  Amalia Greenhouse, MD

## 2021-08-23 ENCOUNTER — Encounter (HOSPITAL_BASED_OUTPATIENT_CLINIC_OR_DEPARTMENT_OTHER): Payer: Self-pay | Admitting: Orthopaedic Surgery

## 2021-08-26 NOTE — Addendum Note (Signed)
Addendum  created 08/26/21 5035 by Lynnetta Tom, Jewel Baize, CRNA   Charge Capture section accepted

## 2021-09-03 ENCOUNTER — Other Ambulatory Visit: Payer: No Typology Code available for payment source | Admitting: *Deleted

## 2021-09-03 ENCOUNTER — Other Ambulatory Visit: Payer: Self-pay

## 2021-09-03 DIAGNOSIS — I4819 Other persistent atrial fibrillation: Secondary | ICD-10-CM

## 2021-09-03 LAB — BASIC METABOLIC PANEL
BUN/Creatinine Ratio: 24 — ABNORMAL HIGH (ref 9–20)
BUN: 20 mg/dL (ref 6–24)
CO2: 24 mmol/L (ref 20–29)
Calcium: 9.7 mg/dL (ref 8.7–10.2)
Chloride: 101 mmol/L (ref 96–106)
Creatinine, Ser: 0.84 mg/dL (ref 0.76–1.27)
Glucose: 90 mg/dL (ref 70–99)
Potassium: 4.3 mmol/L (ref 3.5–5.2)
Sodium: 139 mmol/L (ref 134–144)
eGFR: 103 mL/min/{1.73_m2} (ref >59)

## 2021-09-03 LAB — CBC WITH DIFFERENTIAL/PLATELET
Basophils Absolute: 0.1 10*3/uL (ref 0.0–0.2)
Basos: 1 %
EOS (ABSOLUTE): 0.3 10*3/uL (ref 0.0–0.4)
Eos: 3 %
Hematocrit: 46.7 % (ref 37.5–51.0)
Hemoglobin: 16.4 g/dL (ref 13.0–17.7)
Immature Grans (Abs): 0 10*3/uL (ref 0.0–0.1)
Immature Granulocytes: 0 %
Lymphocytes Absolute: 2.9 10*3/uL (ref 0.7–3.1)
Lymphs: 37 %
MCH: 32.4 pg (ref 26.6–33.0)
MCHC: 35.1 g/dL (ref 31.5–35.7)
MCV: 92 fL (ref 79–97)
Monocytes Absolute: 0.6 10*3/uL (ref 0.1–0.9)
Monocytes: 8 %
Neutrophils Absolute: 4 10*3/uL (ref 1.4–7.0)
Neutrophils: 51 %
Platelets: 243 10*3/uL (ref 150–450)
RBC: 5.06 x10E6/uL (ref 4.14–5.80)
RDW: 13.1 % (ref 11.6–15.4)
WBC: 7.9 10*3/uL (ref 3.4–10.8)

## 2021-09-16 ENCOUNTER — Telehealth (HOSPITAL_COMMUNITY): Payer: Self-pay | Admitting: *Deleted

## 2021-09-16 NOTE — Telephone Encounter (Signed)
Reaching out to patient to offer assistance regarding upcoming cardiac imaging study; pt's wife verbalizes understanding of appt date/time, parking situation and where to check in, pre-test NPO status, and verified current allergies; name and call back number provided for further questions should they arise  Larey Brick RN Navigator Cardiac Imaging [facility] [phone] office [phone] cell  Patient's wife was informed that patient will need to arrive at 7:30am for his 8am scan.  He is aware to take his extra metoprolol if he has Afib in the morning.

## 2021-09-17 ENCOUNTER — Ambulatory Visit (HOSPITAL_COMMUNITY)
Admission: RE | Admit: 2021-09-17 | Discharge: 2021-09-17 | Disposition: A | Discharge status: Home / Self Care | Payer: No Typology Code available for payment source | Source: Ambulatory Visit | Attending: Internal Medicine | Admitting: Internal Medicine

## 2021-09-17 ENCOUNTER — Encounter (HOSPITAL_COMMUNITY): Payer: Self-pay

## 2021-09-17 ENCOUNTER — Other Ambulatory Visit: Payer: Self-pay

## 2021-09-17 DIAGNOSIS — I4819 Other persistent atrial fibrillation: Secondary | ICD-10-CM

## 2021-09-17 MED ORDER — IOHEXOL 350 MG/ML SOLN
95.0000 mL | Freq: Once | INTRAVENOUS | Status: AC | PRN
  Administered 2021-09-17: 95 mL via INTRAVENOUS

## 2021-09-18 ENCOUNTER — Encounter: Payer: Self-pay | Admitting: Internal Medicine

## 2021-09-18 ENCOUNTER — Telehealth: Payer: Self-pay | Admitting: Cardiology

## 2021-09-18 NOTE — Telephone Encounter (Signed)
Spoke with Erskine Squibb from [facility], calling imaging report.  The following was noted on imaging and needs to be addressed by MD:  IMPRESSION: Incompletely imaged subcutaneous nodule in the posterior back, more likely a sebaceous cyst but incompletely evaluated and not definitive in terms of density. Consider dedicated assessment with direct visualization/physical examination to exclude cutaneous lesion. Will route to MD to address.

## 2021-09-18 NOTE — Telephone Encounter (Signed)
[facility] calling to triage for a call report

## 2021-09-22 NOTE — Telephone Encounter (Signed)
Will discuss with the patient on the day of ablation.

## 2021-09-23 NOTE — Pre-Procedure Instructions (Signed)
Attempted to call patient regarding procedure instructions for tomorrow.  Left voice mail on the following items:  Arrival time 0830 Nothing to eat or drink after midnight No meds AM of procedure Responsible person to drive you home and stay with you for 24 hrs  Have you missed any doses of anti-coagulant Eliquis- take both doses today, none in the morning    

## 2021-09-24 ENCOUNTER — Ambulatory Visit (HOSPITAL_COMMUNITY): Payer: No Typology Code available for payment source | Admitting: Certified Registered Nurse Anesthetist

## 2021-09-24 ENCOUNTER — Other Ambulatory Visit: Payer: Self-pay

## 2021-09-24 ENCOUNTER — Encounter (HOSPITAL_COMMUNITY)
Admission: RE | Disposition: A | Discharge status: Home / Self Care | Payer: Self-pay | Source: Ambulatory Visit | Attending: Internal Medicine

## 2021-09-24 ENCOUNTER — Encounter (HOSPITAL_COMMUNITY): Payer: Self-pay | Admitting: Internal Medicine

## 2021-09-24 ENCOUNTER — Ambulatory Visit (HOSPITAL_COMMUNITY)
Admission: RE | Admit: 2021-09-24 | Discharge: 2021-09-24 | Disposition: A | Discharge status: Home / Self Care | Payer: No Typology Code available for payment source | Source: Ambulatory Visit | Attending: Internal Medicine | Admitting: Internal Medicine

## 2021-09-24 DIAGNOSIS — Z7901 Long term (current) use of anticoagulants: Secondary | ICD-10-CM | POA: Diagnosis not present

## 2021-09-24 DIAGNOSIS — I440 Atrioventricular block, first degree: Secondary | ICD-10-CM | POA: Diagnosis not present

## 2021-09-24 DIAGNOSIS — I483 Typical atrial flutter: Secondary | ICD-10-CM | POA: Diagnosis not present

## 2021-09-24 DIAGNOSIS — I4819 Other persistent atrial fibrillation: Secondary | ICD-10-CM | POA: Diagnosis present

## 2021-09-24 HISTORY — PX: ATRIAL FIBRILLATION ABLATION: EP1191

## 2021-09-24 LAB — POCT ACTIVATED CLOTTING TIME
Activated Clotting Time: 341 seconds
Activated Clotting Time: 347 seconds

## 2021-09-24 SURGERY — ATRIAL FIBRILLATION ABLATION
Anesthesia: General

## 2021-09-24 MED ORDER — PHENYLEPHRINE 40 MCG/ML (10ML) SYRINGE FOR IV PUSH (FOR BLOOD PRESSURE SUPPORT)
PREFILLED_SYRINGE | INTRAVENOUS | Status: DC | PRN
  Administered 2021-09-24 (×4): 80 ug via INTRAVENOUS
  Administered 2021-09-24: 40 ug via INTRAVENOUS

## 2021-09-24 MED ORDER — DEXAMETHASONE SODIUM PHOSPHATE 10 MG/ML IJ SOLN
INTRAMUSCULAR | Status: DC | PRN
  Administered 2021-09-24: 10 mg via INTRAVENOUS

## 2021-09-24 MED ORDER — ROCURONIUM BROMIDE 10 MG/ML (PF) SYRINGE
PREFILLED_SYRINGE | INTRAVENOUS | Status: DC | PRN
  Administered 2021-09-24: 10 mg via INTRAVENOUS
  Administered 2021-09-24: 60 mg via INTRAVENOUS

## 2021-09-24 MED ORDER — HEPARIN SODIUM (PORCINE) 1000 UNIT/ML IJ SOLN
INTRAMUSCULAR | Status: AC
  Filled 2021-09-24: qty 10

## 2021-09-24 MED ORDER — HEPARIN (PORCINE) IN NACL 1000-0.9 UT/500ML-% IV SOLN
INTRAVENOUS | Status: DC | PRN
  Administered 2021-09-24 (×3): 500 mL

## 2021-09-24 MED ORDER — PROPOFOL 10 MG/ML IV BOLUS
INTRAVENOUS | Status: DC | PRN
  Administered 2021-09-24: 150 mg via INTRAVENOUS

## 2021-09-24 MED ORDER — ACETAMINOPHEN 325 MG PO TABS: 650.0000 mg | ORAL_TABLET | ORAL | Status: DC | PRN

## 2021-09-24 MED ORDER — ISOPROTERENOL HCL 0.2 MG/ML IJ SOLN
INTRAVENOUS | Status: DC | PRN
  Administered 2021-09-24: 20 ug/min via INTRAVENOUS

## 2021-09-24 MED ORDER — HEPARIN (PORCINE) IN NACL 1000-0.9 UT/500ML-% IV SOLN
INTRAVENOUS | Status: AC
  Filled 2021-09-24: qty 500

## 2021-09-24 MED ORDER — SODIUM CHLORIDE 0.9 % IV SOLN: 250.0000 mL | INTRAVENOUS | Status: DC | PRN

## 2021-09-24 MED ORDER — ALBUTEROL SULFATE HFA 108 (90 BASE) MCG/ACT IN AERS
INHALATION_SPRAY | RESPIRATORY_TRACT | Status: DC | PRN
  Administered 2021-09-24: 2 via RESPIRATORY_TRACT

## 2021-09-24 MED ORDER — ONDANSETRON HCL 4 MG/2ML IJ SOLN
INTRAMUSCULAR | Status: DC | PRN
  Administered 2021-09-24: 4 mg via INTRAVENOUS

## 2021-09-24 MED ORDER — FENTANYL CITRATE (PF) 250 MCG/5ML IJ SOLN
INTRAMUSCULAR | Status: DC | PRN
  Administered 2021-09-24 (×2): 50 ug via INTRAVENOUS

## 2021-09-24 MED ORDER — HEPARIN SODIUM (PORCINE) 1000 UNIT/ML IJ SOLN
INTRAMUSCULAR | Status: DC | PRN
  Administered 2021-09-24: 15000 [IU] via INTRAVENOUS

## 2021-09-24 MED ORDER — SODIUM CHLORIDE 0.9 % IV SOLN: INTRAVENOUS | Status: DC

## 2021-09-24 MED ORDER — APIXABAN 5 MG PO TABS
5.0000 mg | ORAL_TABLET | Freq: Once | ORAL | Status: AC
  Administered 2021-09-24: 5 mg via ORAL
  Filled 2021-09-24: qty 1

## 2021-09-24 MED ORDER — PANTOPRAZOLE SODIUM 40 MG PO TBEC: 40.0000 mg | DELAYED_RELEASE_TABLET | Freq: Every day | ORAL | 0 refills | Status: DC

## 2021-09-24 MED ORDER — SUGAMMADEX SODIUM 200 MG/2ML IV SOLN
INTRAVENOUS | Status: DC | PRN
  Administered 2021-09-24: 200 mg via INTRAVENOUS

## 2021-09-24 MED ORDER — PROTAMINE SULFATE 10 MG/ML IV SOLN
INTRAVENOUS | Status: DC | PRN
  Administered 2021-09-24: 40 mg via INTRAVENOUS

## 2021-09-24 MED ORDER — SODIUM CHLORIDE 0.9% FLUSH: 3.0000 mL | Freq: Two times a day (BID) | INTRAVENOUS | Status: DC

## 2021-09-24 MED ORDER — PHENYLEPHRINE HCL-NACL 20-0.9 MG/250ML-% IV SOLN
INTRAVENOUS | Status: DC | PRN
  Administered 2021-09-24: 20 ug/min via INTRAVENOUS

## 2021-09-24 MED ORDER — ONDANSETRON HCL 4 MG/2ML IJ SOLN: 4.0000 mg | Freq: Four times a day (QID) | INTRAMUSCULAR | Status: DC | PRN

## 2021-09-24 MED ORDER — LIDOCAINE 2% (20 MG/ML) 5 ML SYRINGE
INTRAMUSCULAR | Status: DC | PRN
  Administered 2021-09-24: 80 mg via INTRAVENOUS

## 2021-09-24 MED ORDER — HYDROCODONE-ACETAMINOPHEN 5-325 MG PO TABS: 1.0000 | ORAL_TABLET | ORAL | Status: DC | PRN

## 2021-09-24 MED ORDER — SODIUM CHLORIDE 0.9% FLUSH: 3.0000 mL | INTRAVENOUS | Status: DC | PRN

## 2021-09-24 MED ORDER — MIDAZOLAM HCL 2 MG/2ML IJ SOLN
INTRAMUSCULAR | Status: DC | PRN
  Administered 2021-09-24: 2 mg via INTRAVENOUS

## 2021-09-24 MED ORDER — HEPARIN (PORCINE) IN NACL 2000-0.9 UNIT/L-% IV SOLN
INTRAVENOUS | Status: DC | PRN
  Administered 2021-09-24: 1000 mL

## 2021-09-24 MED ORDER — ISOPROTERENOL HCL 0.2 MG/ML IJ SOLN
INTRAMUSCULAR | Status: AC
  Filled 2021-09-24: qty 5

## 2021-09-24 SURGICAL SUPPLY — 18 items
CATH OCTARAY 2.0 F 3-3-3-3-3 (CATHETERS) ×2 IMPLANT
CATH SMTCH THERMOCOOL SF DF (CATHETERS) ×2 IMPLANT
CATH SOUNDSTAR ECO 8FR (CATHETERS) ×2 IMPLANT
CATH WEB BI DIR CSDF CRV REPRO (CATHETERS) ×2 IMPLANT
CLOSURE PERCLOSE PROSTYLE (VASCULAR PRODUCTS) ×6 IMPLANT
COVER SWIFTLINK CONNECTOR (BAG) ×2 IMPLANT
MAT PREVALON FULL STRYKER (MISCELLANEOUS) ×2 IMPLANT
NEEDLE BAYLIS TRANSSEPTAL 71CM (NEEDLE) ×2 IMPLANT
NEEDLE PERC 18GX7CM (NEEDLE) ×2 IMPLANT
PACK EP LATEX FREE (CUSTOM PROCEDURE TRAY) ×2
PACK EP LF (CUSTOM PROCEDURE TRAY) ×1 IMPLANT
PAD DEFIB RADIO PHYSIO CONN (PAD) ×2 IMPLANT
PATCH CARTO3 (PAD) ×2 IMPLANT
SHEATH PINNACLE 7F 10CM (SHEATH) ×4 IMPLANT
SHEATH PINNACLE 9F 10CM (SHEATH) ×2 IMPLANT
SHEATH PROBE COVER 6X72 (BAG) ×2 IMPLANT
SHEATH SWARTZ TS SL2 63CM 8.5F (SHEATH) ×2 IMPLANT
TUBING SMART ABLATE COOLFLOW (TUBING) ×4 IMPLANT

## 2021-09-24 NOTE — Anesthesia Procedure Notes (Signed)
Procedure Name: Intubation Date/Time: 09/24/2021 10:52 AM Performed by: Dorthea Cove, CRNA Pre-anesthesia Checklist: Patient identified, Emergency Drugs available, Suction available and Patient being monitored Patient Re-evaluated:Patient Re-evaluated prior to induction Oxygen Delivery Method: Circle system utilized Preoxygenation: Pre-oxygenation with 100% oxygen Induction Type: IV induction and Cricoid Pressure applied Ventilation: Mask ventilation without difficulty and Two handed mask ventilation required Laryngoscope Size: Mac and 4 Grade View: Grade II Tube type: Oral Tube size: 7.5 mm Number of attempts: 1 Airway Equipment and Method: Stylet and Oral airway Placement Confirmation: ETT inserted through vocal cords under direct vision, positive ETCO2 and breath sounds checked- equal and bilateral Secured at: 23 cm Tube secured with: Tape Dental Injury: Teeth and Oropharynx as per pre-operative assessment

## 2021-09-24 NOTE — Transfer of Care (Signed)
Immediate Anesthesia Transfer of Care Note  Patient: Christopher [name]  Procedure(s) Performed: ATRIAL FIBRILLATION ABLATION  Patient Location: Cath Lab  Anesthesia Type:General  Level of Consciousness: awake, oriented, drowsy and patient cooperative  Airway & Oxygen Therapy: Patient Spontanous Breathing and Patient connected to face mask oxygen  Post-op Assessment: Report given to RN and Post -op Vital signs reviewed and stable  Post vital signs: Reviewed and stable  Last Vitals:  Vitals Value Taken Time  BP    Temp    Pulse 85 09/24/21 1303  Resp 21 09/24/21 1303  SpO2 95 % 09/24/21 1303  Vitals shown include unvalidated device data.  Last Pain:  Vitals:   09/24/21 0837  TempSrc: Oral         Complications: No notable events documented.

## 2021-09-24 NOTE — Anesthesia Postprocedure Evaluation (Signed)
Anesthesia Post Note  Patient: Christopher [name]  Procedure(s) Performed: ATRIAL FIBRILLATION ABLATION     Patient location during evaluation: PACU Anesthesia Type: General Level of consciousness: sedated and patient cooperative Pain management: pain level controlled Vital Signs Assessment: post-procedure vital signs reviewed and stable Respiratory status: spontaneous breathing Cardiovascular status: stable Anesthetic complications: no   No notable events documented.  Last Vitals:  Vitals:   09/24/21 1520 09/24/21 1530  BP: 125/75   Pulse: 83 83  Resp: 16 (!) 21  Temp:    SpO2: 96% 94%    Last Pain:  Vitals:   09/24/21 1416  TempSrc:   PainSc: 0-No pain                 Lewie Loron

## 2021-09-24 NOTE — H&P (Signed)
CC: afib   History of Present Illness: Christopher [name] is a 55 y.o. male who presents today for electrophysiology study and ablation for afib/ atrial flutter.   He was initially found to have afib in May.  + fatigue and SOB.  He was started on [facility] and cardioverted.  He returned to afib.  He was placed on flecainide.  He has subsequently developed atrial flutter.   + ongoing concerns about fatigue.  He reports "I am tired all of the time". Today, he denies symptoms of chest pain, shortness of breath, orthopnea, PND, lower extremity edema, claudication, dizziness, presyncope, syncope, bleeding, or neurologic sequela. The patient is tolerating medications without difficulties and is otherwise without complaint today.          Past Medical History:  Diagnosis Date   Diverticulitis     Hypercholesteremia     Hypertension     Low back pain      recurrent   OSA (obstructive sleep apnea)      Mild         Past Surgical History:  Procedure Laterality Date   CARDIOVERSION N/A 05/17/2021    Procedure: CARDIOVERSION;  Surgeon: [name], MD;  Location: [facility];  Service: Cardiovascular;  Laterality: N/A;   LAPAROSCOPIC SIGMOID COLECTOMY                  Current Outpatient Medications  Medication Sig Dispense Refill   amLODipine (NORVASC) 5 MG tablet Take 5 mg by mouth at bedtime.       apixaban (ELIQUIS) 5 MG TABS tablet Take 1 tablet (5 mg total) by mouth 2 (two) times daily. 60 tablet 3   atorvastatin (LIPITOR) 20 MG tablet Take 20 mg by mouth at bedtime.       Coenzyme Q10 (COQ-10 PO) Take 1 capsule by mouth daily. Qunol       flecainide (TAMBOCOR) 50 MG tablet Take 1 tablet (50 mg total) by mouth 2 (two) times daily. 180 tablet 2   furosemide (LASIX) 20 MG tablet Take 1 tablet (20 mg total) by mouth daily as needed for fluid or edema. 30 tablet 3   metoprolol succinate (TOPROL-XL) 25 MG 24 hr tablet Take 75 mg by mouth in the morning and at bedtime.       metoprolol  tartrate (LOPRESSOR) 25 MG tablet Take 0.5 tablets (12.5 mg total) by mouth 2 (two) times daily as needed (for palpitations). 30 tablet 2    No current facility-administered medications for this visit.      Allergies:   Patient has no known allergies.    Social History:  The patient  reports that he has been smoking cigarettes. He has been smoking an average of 1 pack per day. He has never used smokeless tobacco. He reports current alcohol use of about 1.0 - 2.0 standard drink per week. He reports that he does not use drugs.    Family History:  + HTN   ROS:  Please see the history of present illness.   All other systems are personally reviewed and negative.      PHYSICAL EXAM: Vitals:   09/24/21 0837  BP: 110/78  Pulse: 72  Resp: 15  Temp: 97.9 F (36.6 C)  SpO2: 96%    GEN: Well nourished, well developed, in no acute distress HEENT: normal Neck: no JVD, carotid bruits, or masses Cardiac: RRR; no murmurs, rubs, or gallops,no edema  Respiratory:  clear to auscultation bilaterally, normal work of breathing GI:  soft, nontender, nondistended, + BS MS: no deformity or atrophy Skin: large subcutaneous cyst noted on lower back consistent with CT report.  Patient is aware.  Appears to be cystic.  I have advised that he follow-up with dermatology and primary care for treatment. Neuro:  Strength and sensation are intact Psych: euthymic mood, full affect       ASSESSMENT AND PLAN:   1.  Persistent atrial fibrillation/ typical atrial flutter The patient has symptomatic, recurrent  atrial arrhythmias. he has failed medical therapy with flecainide.  Given first degree AV block, I am reluctant to increase flecainide dosing Chads2vasc score is 1.  he is anticoagulated with eliquis .   Risk, benefits, and alternatives to EP study and radiofrequency ablation for afib were again discussed in detail today. These risks include but are not limited to stroke, bleeding, vascular damage,  tamponade, perforation, damage to the esophagus, lungs, and other structures, pulmonary vein stenosis, worsening renal function, and death. The patient understands these risk and wishes to proceed.    Cardiac CT reviewed at length with the patient today.  Cyst findings as per exam above. 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  I have reached out to Dr Marisue Ivan to review and determine next steps.  he reports compliance with [place] without interruption.  Thompson Grayer MD, [place] 09/24/2021 10:24 AM

## 2021-09-24 NOTE — Discharge Instructions (Addendum)
Cardiac Ablation, Care After  This sheet gives you information about how to care for yourself after your procedure. Your health care provider may also give you more specific instructions. If you have problems or questions, contact your health care provider. What can I expect after the procedure? After the procedure, it is common to have: Bruising around your puncture site. Tenderness around your puncture site. Skipped heartbeats. Tiredness (fatigue).  Follow these instructions at home: Puncture site care  Follow instructions from your health care provider about how to take care of your puncture site. Make sure you: If present, leave stitches (sutures), skin glue, or adhesive strips in place. These skin closures may need to stay in place for up to 2 weeks. If adhesive strip edges start to loosen and curl up, you may trim the loose edges. Do not remove adhesive strips completely unless your health care provider tells you to do that. If a large square bandage is present, this may be removed 24 hours after surgery.  Check your puncture site every day for signs of infection. Check for: Redness, swelling, or pain. Fluid or blood. If your puncture site starts to bleed, lie down on your back, apply firm pressure to the area, and contact your health care provider. Warmth. Pus or a bad smell. Driving Do not drive for at least 4 days after your procedure or however long your health care provider recommends. (Do not resume driving if you have previously been instructed not to drive for other health reasons.) Do not drive or use heavy machinery while taking prescription pain medicine. Activity Avoid activities that take a lot of effort for at least 7 days after your procedure. Do not lift anything that is heavier than 5 lb (4.5 kg) for one week.  No sexual activity for 1 week.  Return to your normal activities as told by your health care provider. Ask your health care provider what activities are safe  for you. General instructions Take over-the-counter and prescription medicines only as told by your health care provider. Do not use any products that contain nicotine or tobacco, such as cigarettes and e-cigarettes. If you need help quitting, ask your health care provider. You may shower after 24 hours, but Do not take baths, swim, or use a hot tub for 1 week.  Do not drink alcohol for 24 hours after your procedure. Keep all follow-up visits as told by your health care provider. This is important. Contact a health care provider if: You have redness, mild swelling, or pain around your puncture site. You have fluid or blood coming from your puncture site that stops after applying firm pressure to the area. Your puncture site feels warm to the touch. You have pus or a bad smell coming from your puncture site. You have a fever. You have chest pain or discomfort that spreads to your neck, jaw, or arm. You are sweating a lot. You feel nauseous. You have a fast or irregular heartbeat. You have shortness of breath. You are dizzy or light-headed and feel the need to lie down. You have pain or numbness in the arm or leg closest to your puncture site. Get help right away if: Your puncture site suddenly swells. Your puncture site is bleeding and the bleeding does not stop after applying firm pressure to the area. These symptoms may represent a serious problem that is an emergency. Do not wait to see if the symptoms will go away. Get medical help right away. Call your local emergency   services (911 in the [place] Do not drive yourself to the hospital. Summary After the procedure, it is normal to have bruising and tenderness at the puncture site in your groin, neck, or forearm. Check your puncture site every day for signs of infection. Get help right away if your puncture site is bleeding and the bleeding does not stop after applying firm pressure to the area. This is a medical emergency. This  information is not intended to replace advice given to you by your health care provider. Make sure you discuss any questions you have with your health care provider.       Take lasix 20mg  twice daily x 3 days, then lasix 20mg  daily

## 2021-09-24 NOTE — Anesthesia Preprocedure Evaluation (Signed)
Anesthesia Evaluation  Patient identified by MRN, date of birth, ID band Patient awake    Reviewed: Allergy & Precautions, NPO status , Patient's Chart, lab work & pertinent test results, reviewed documented beta blocker date and time   History of Anesthesia Complications Negative for: history of anesthetic complications  Airway Mallampati: II  TM Distance: >3 FB Neck ROM: Full    Dental no notable dental hx.    Pulmonary sleep apnea , Current Smoker,    Pulmonary exam normal        Cardiovascular hypertension, Pt. on medications and Pt. on home beta blockers Normal cardiovascular exam+ dysrhythmias (on Eliquis and flecainide) Atrial Fibrillation   TTE 03/2021: EF 55-60%, valves ok   Neuro/Psych negative neurological ROS  negative psych ROS   GI/Hepatic negative GI ROS, Neg liver ROS,   Endo/Other  negative endocrine ROS  Renal/GU negative Renal ROS  negative genitourinary   Musculoskeletal negative musculoskeletal ROS (+)   Abdominal   Peds  Hematology negative hematology ROS (+)   Anesthesia Other Findings Day of surgery medications reviewed with patient.  Reproductive/Obstetrics negative OB ROS                             Anesthesia Physical  Anesthesia Plan  ASA: 2  Anesthesia Plan: General   Post-op Pain Management: Minimal or no pain anticipated   Induction: Intravenous  PONV Risk Score and Plan: 2 and Treatment may vary due to age or medical condition, Ondansetron, Dexamethasone and Midazolam  Airway Management Planned: Oral ETT  Additional Equipment: None  Intra-op Plan:   Post-operative Plan: Extubation in OR  Informed Consent: I have reviewed the patients History and Physical, chart, labs and discussed the procedure including the risks, benefits and alternatives for the proposed anesthesia with the patient or authorized representative who has indicated his/her  understanding and acceptance.     Dental advisory given  Plan Discussed with: CRNA  Anesthesia Plan Comments:        Anesthesia Quick Evaluation

## 2021-09-25 ENCOUNTER — Encounter (HOSPITAL_COMMUNITY): Payer: Self-pay | Admitting: Internal Medicine

## 2021-09-26 ENCOUNTER — Telehealth: Payer: Self-pay | Admitting: *Deleted

## 2021-09-26 DIAGNOSIS — Z0189 Encounter for other specified special examinations: Secondary | ICD-10-CM

## 2021-09-26 DIAGNOSIS — Q264 Anomalous pulmonary venous connection, unspecified: Secondary | ICD-10-CM

## 2021-09-26 NOTE — Telephone Encounter (Signed)
-----   Message from [name], MD sent at 09/25/2021  3:56 PM EST ----- Called and spoke to the patient about his CT findings. 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 Thank you so much!!

## 2021-09-26 NOTE — Telephone Encounter (Signed)
Order for Cardiac MRI placed as well as a future BMET if needed.  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 Johney Frame and Dr. Shari Prows, as noted on recent Cardiac CT pt had done. Will send a staff message to our MRI scheduler to call the pt back and arrange this appt.  Pt aware that he will be getting a call back to schedule.

## 2021-10-11 ENCOUNTER — Encounter: Payer: Self-pay | Admitting: Cardiology

## 2021-10-11 DIAGNOSIS — R002 Palpitations: Secondary | ICD-10-CM

## 2021-10-11 DIAGNOSIS — M7989 Other specified soft tissue disorders: Secondary | ICD-10-CM

## 2021-10-11 DIAGNOSIS — I4891 Unspecified atrial fibrillation: Secondary | ICD-10-CM

## 2021-10-11 MED ORDER — FUROSEMIDE 40 MG PO TABS: 40.0000 mg | ORAL_TABLET | Freq: Every day | ORAL | 3 refills | Status: DC

## 2021-10-11 MED ORDER — POTASSIUM CHLORIDE CRYS ER 20 MEQ PO TBCR: 40.0000 meq | EXTENDED_RELEASE_TABLET | Freq: Every day | ORAL | 3 refills | Status: DC

## 2021-10-11 NOTE — Telephone Encounter (Signed)
Christopher Sprague, MD  P Cv [facility] Triage Can we get him in with DOD next week. He is fluid overloaded.   I told him to increase lasix to 40mg  BID for 3 days and then daily thereafter. Can we send him potassium daily to take with the lasix. When he comes for appointment, can we check BMET, Mg, and BNP?   Thank you so much!!   -Heather    Called patient's wife (DPR) with Dr. recommendations. Made patient an appointment with DOD on Tuesday. Placed orders for all lab work and made appointment. Sent in prescription for lasix and potassium. Patient's wife verbalized understanding.

## 2021-10-15 ENCOUNTER — Other Ambulatory Visit: Payer: Self-pay

## 2021-10-15 ENCOUNTER — Ambulatory Visit (INDEPENDENT_AMBULATORY_CARE_PROVIDER_SITE_OTHER): Payer: No Typology Code available for payment source | Admitting: Cardiology

## 2021-10-15 ENCOUNTER — Other Ambulatory Visit: Payer: No Typology Code available for payment source

## 2021-10-15 ENCOUNTER — Encounter: Payer: Self-pay | Admitting: Cardiology

## 2021-10-15 VITALS — BP 112/80 | HR 69 | Ht 66.0 in | Wt 209.6 lb

## 2021-10-15 DIAGNOSIS — I480 Paroxysmal atrial fibrillation: Secondary | ICD-10-CM | POA: Diagnosis not present

## 2021-10-15 DIAGNOSIS — M7989 Other specified soft tissue disorders: Secondary | ICD-10-CM

## 2021-10-15 NOTE — Progress Notes (Signed)
Electrophysiology Office Note   Date:  10/15/2021   ID:  Christopher [name], DOB 1966/09/18, MRN [id number]  PCP:  Kelton Pillar, MD  Cardiologist:  Johney Frame Primary Electrophysiologist: [facility]    Chief Complaint: Abdominal bloating   History of Present Illness: Christopher [name] is a 55 y.o. male who is being seen today for the evaluation of abdominal bloating at the request of Kelton Pillar, MD. Presenting today for electrophysiology evaluation.  He has a history significant for atrial fibrillation, hypertension, hyperlipidemia, obstructive sleep apnea.  He is status post atrial fibrillation ablation 09/24/2021.  Since his ablation, he has had abdominal bloating and lower extremity edema.  His weight is up 15 pounds.  He called in the clinic and was prescribed twice daily Lasix.  He is now down to taking Lasix on a daily basis.  He is lost approximately 5 pounds.  He has less bloating and less lower extremity edema, though he still has early satiety and discomfort in his abdomen.  He has been short of breath as well.  Today, he denies symptoms of palpitations, chest pain, orthopnea, PND, claudication, dizziness, presyncope, syncope, bleeding, or neurologic sequela. The patient is tolerating medications without difficulties.    Past Medical History:  Diagnosis Date   A-fib ([place])    on Eliquis, flecainide and metoprolol   Diverticulitis    Hypercholesteremia    Hypertension    Low back pain    recurrent   OSA (obstructive sleep apnea)    Mild   Past Surgical History:  Procedure Laterality Date   ATRIAL FIBRILLATION ABLATION N/A 09/24/2021   Procedure: ATRIAL FIBRILLATION ABLATION;  Surgeon: Thompson Grayer, MD;  Location: [place] [facility];  Service: Cardiovascular;  Laterality: N/A;   BICEPT TENODESIS Right 08/22/2021   Procedure: OPEN BICEPS TENODESIS;  Surgeon: Hiram Gash, MD;  Location: [place];  Service: Orthopedics;  Laterality: Right;   CARDIOVERSION  N/A 05/17/2021   Procedure: CARDIOVERSION;  Surgeon: Pixie Casino, MD;  Location: [facility]OSCOPY;  Service: Cardiovascular;  Laterality: N/A;   LAPAROSCOPIC SIGMOID COLECTOMY     SHOULDER ARTHROSCOPY WITH SUBACROMIAL DECOMPRESSION Right 08/22/2021   Procedure: SHOULDER ARTHROSCOPY WITH SUBACROMIAL DECOMPRESSION;  Surgeon: Hiram Gash, MD;  Location: [place];  Service: Orthopedics;  Laterality: Right;     Current Outpatient Medications  Medication Sig Dispense Refill   amLODipine (NORVASC) 5 MG tablet Take 5 mg by mouth at bedtime.     apixaban (ELIQUIS) 5 MG TABS tablet Take 1 tablet (5 mg total) by mouth 2 (two) times daily. 60 tablet 5   atorvastatin (LIPITOR) 20 MG tablet Take 20 mg by mouth at bedtime.     Coenzyme Q10 (COQ-10 PO) Take 1 capsule by mouth daily. Qunol     flecainide (TAMBOCOR) 50 MG tablet Take 1 tablet (50 mg total) by mouth 2 (two) times daily. 180 tablet 2   furosemide (LASIX) 40 MG tablet Take 1 tablet (40 mg total) by mouth daily. 30 tablet 3   metoprolol succinate (TOPROL-XL) 50 MG 24 hr tablet Take 75 mg by mouth in the morning and at bedtime.     metoprolol tartrate (LOPRESSOR) 25 MG tablet Take 0.5 tablets (12.5 mg total) by mouth 2 (two) times daily as needed (for palpitations). 30 tablet 2   pantoprazole (PROTONIX) 40 MG tablet Take 1 tablet (40 mg total) by mouth daily. 45 tablet 0   potassium chloride SA (KLOR-CON M) 20 MEQ tablet Take 2 tablets (40  mEq total) by mouth daily. 60 tablet 3   No current facility-administered medications for this visit.    Allergies:   Patient has no known allergies.   Social History:  The patient  reports that he has been smoking cigarettes. He has been smoking an average of 1 pack per day. He has never used smokeless tobacco. He reports current alcohol use of about 1.0 - 2.0 standard drink per week. He reports that he does not use drugs.   Family History:  The patient's family history negative for heart  attack  ROS:  Please see the history of present illness.   Otherwise, review of systems is positive for none.   All other systems are reviewed and negative.    PHYSICAL EXAM: VS:  There were no vitals taken for this visit. , BMI There is no height or weight on file to calculate BMI. GEN: Well nourished, well developed, in no acute distress  HEENT: normal  Neck: no JVD, carotid bruits, or masses Cardiac: RRR; no murmurs, rubs, or gallops,no edema  Respiratory:  clear to auscultation bilaterally, normal work of breathing GI: soft, nontender, nondistended, + BS MS: no deformity or atrophy  Skin: warm and dry Neuro:  Strength and sensation are intact Psych: euthymic mood, full affect  EKG:  EKG is ordered today. Personal review of the ekg ordered shows sinus rhythm, rate 69  Recent Labs: 09/03/2021: BUN 20; Creatinine, Ser 0.84; Hemoglobin 16.4; Platelets 243; Potassium 4.3; Sodium 139    Lipid Panel  No results found for: CHOL, TRIG, HDL, CHOLHDL, VLDL, LDLCALC, LDLDIRECT   Wt Readings from Last 3 Encounters:  09/24/21 197 lb (89.4 kg)  08/22/21 202 lb 6.1 oz (91.8 kg)  07/15/21 198 lb 12.8 oz (90.2 kg)      Other studies Reviewed: Additional studies/ records that were reviewed today include: TTE 04/19/21  Review of the above records today demonstrates:   1. Left ventricular ejection fraction, by estimation, is 55 to 60%. The  left ventricle has normal function. The left ventricle has no regional  wall motion abnormalities. Left ventricular diastolic function could not  be evaluated.   2. Right ventricular systolic function is normal. The right ventricular  size is normal. There is normal pulmonary artery systolic pressure.   3. The mitral valve is normal in structure. Trivial mitral valve  regurgitation. No evidence of mitral stenosis.   4. The aortic valve is tricuspid. Aortic valve regurgitation is not  visualized. No aortic stenosis is present.   5. The inferior vena  cava is normal in size with greater than 50%  respiratory variability, suggesting right atrial pressure of 3 mmHg.    ASSESSMENT AND PLAN:  1.  Paroxysmal atrial fibrillation/flutter: Currently on Eliquis 5 mg twice daily.  Status post ablation 09/24/2021.  Remains in normal rhythm.  Has follow-up in A. fib clinic upcoming.  2.  Hypertension: Currently well controlled  3.  Shortness of breath: Has evidence of volume overload.  He is 10 pounds over his base weight.  He has abdominal bloating and at times lower extremity edema.  I have told him to continue with the current dose of Lasix.  He has follow-up in A. fib clinic in 1 week.  At that point, would check a BMP for potassium and creatinine.  Case discussed with primary cardiology  Current medicines are reviewed at length with the patient today.   The patient does not have concerns regarding his medicines.  The following changes were  made today:  none  Labs/ tests ordered today include:  No orders of the defined types were placed in this encounter.    Disposition:   FU with Rena Hunke as needed  Signed, Fabian Coca Meredith Leeds, MD  10/15/2021 3:35 PM     [facility] [address] [place] [address] [phone] (office) [phone] (fax)

## 2021-10-16 LAB — BASIC METABOLIC PANEL
BUN/Creatinine Ratio: 21 — ABNORMAL HIGH (ref 9–20)
BUN: 32 mg/dL — ABNORMAL HIGH (ref 6–24)
CO2: 28 mmol/L (ref 20–29)
Calcium: 10.1 mg/dL (ref 8.7–10.2)
Chloride: 96 mmol/L (ref 96–106)
Creatinine, Ser: 1.53 mg/dL — ABNORMAL HIGH (ref 0.76–1.27)
Glucose: 105 mg/dL — ABNORMAL HIGH (ref 70–99)
Potassium: 4.1 mmol/L (ref 3.5–5.2)
Sodium: 142 mmol/L (ref 134–144)
eGFR: 53 mL/min/{1.73_m2} — ABNORMAL LOW (ref >59)

## 2021-10-16 LAB — MAGNESIUM: Magnesium: 2.3 mg/dL (ref 1.6–2.3)

## 2021-10-16 LAB — PRO B NATRIURETIC PEPTIDE: NT-Pro BNP: <36 pg/mL (ref 0–210)

## 2021-10-17 ENCOUNTER — Telehealth: Payer: Self-pay

## 2021-10-17 DIAGNOSIS — I480 Paroxysmal atrial fibrillation: Secondary | ICD-10-CM

## 2021-10-17 NOTE — Telephone Encounter (Signed)
-----   Message from Christell Constant, MD sent at 10/17/2021 10:25 AM EST ----- Increase in creatinine. No lasix changes at this time but will get repeat labs at 10/22/21 visit. ----- Message ----- From: Nell Range Lab Results In Sent: 10/16/2021   6:36 AM EST To: [name], MD

## 2021-10-22 ENCOUNTER — Other Ambulatory Visit: Payer: No Typology Code available for payment source

## 2021-10-22 ENCOUNTER — Other Ambulatory Visit: Payer: Self-pay

## 2021-10-22 ENCOUNTER — Ambulatory Visit (HOSPITAL_COMMUNITY)
Admission: RE | Admit: 2021-10-22 | Discharge: 2021-10-22 | Disposition: A | Discharge status: Home / Self Care | Payer: No Typology Code available for payment source | Source: Ambulatory Visit | Attending: Nurse Practitioner | Admitting: Nurse Practitioner

## 2021-10-22 ENCOUNTER — Encounter (HOSPITAL_COMMUNITY): Payer: Self-pay | Admitting: Nurse Practitioner

## 2021-10-22 VITALS — BP 114/76 | HR 76 | Ht 66.0 in | Wt 211.4 lb

## 2021-10-22 DIAGNOSIS — E785 Hyperlipidemia, unspecified: Secondary | ICD-10-CM | POA: Diagnosis not present

## 2021-10-22 DIAGNOSIS — I10 Essential (primary) hypertension: Secondary | ICD-10-CM | POA: Insufficient documentation

## 2021-10-22 DIAGNOSIS — I4891 Unspecified atrial fibrillation: Secondary | ICD-10-CM | POA: Diagnosis not present

## 2021-10-22 DIAGNOSIS — G4733 Obstructive sleep apnea (adult) (pediatric): Secondary | ICD-10-CM | POA: Insufficient documentation

## 2021-10-22 DIAGNOSIS — D6869 Other thrombophilia: Secondary | ICD-10-CM | POA: Diagnosis not present

## 2021-10-22 DIAGNOSIS — I480 Paroxysmal atrial fibrillation: Secondary | ICD-10-CM

## 2021-10-22 DIAGNOSIS — Z79899 Other long term (current) drug therapy: Secondary | ICD-10-CM | POA: Diagnosis not present

## 2021-10-22 DIAGNOSIS — Z7901 Long term (current) use of anticoagulants: Secondary | ICD-10-CM | POA: Diagnosis not present

## 2021-10-22 LAB — BASIC METABOLIC PANEL
Anion gap: 7 (ref 5–15)
BUN: 18 mg/dL (ref 6–20)
CO2: 28 mmol/L (ref 22–32)
Calcium: 9.2 mg/dL (ref 8.9–10.3)
Chloride: 100 mmol/L (ref 98–111)
Creatinine, Ser: 1.07 mg/dL (ref 0.61–1.24)
GFR, Estimated: >60 mL/min (ref >60)
Glucose, Bld: 118 mg/dL — ABNORMAL HIGH (ref 70–99)
Potassium: 4.2 mmol/L (ref 3.5–5.1)
Sodium: 135 mmol/L (ref 135–145)

## 2021-10-22 NOTE — Progress Notes (Addendum)
Primary Care Physician: Kelton Pillar, MD Referring Physician: Dr. Michail Sermon Denault is a 55 y.o. male with a h/o  atrial fibrillation, hypertension, hyperlipidemia, obstructive sleep apnea.  He is status post atrial fibrillation ablation 09/24/2021.  Since his ablation, he has had abdominal bloating and lower extremity edema.  His weight is up 15 pounds.  He called in the clinic and was prescribed twice daily Lasix.  He is now down to taking Lasix on a daily basis.  He is lost approximately 5 pounds.  He has less bloating and less lower extremity edema, though he still has early satiety and discomfort in his abdomen.  He has been short of breath as well.  He was seen by Dr. Curt Bears and told to hold the course with his current Lasix dose 40 mg daily, he had been on 40 mg bid for the previous 3 days  and follow up in afib clinic.  Today, his weight has not shown any further reduction, he is still 10-11 lbs from  previous ablation weight. He feels bloated and winded, with mild pedal edema. No awareness of afib, no swallowing or groin issues.   Today, he denies symptoms of palpitations, chest pain, shortness of breath, orthopnea, PND, lower extremity edema, dizziness, presyncope, syncope, or neurologic sequela. The patient is tolerating medications without difficulties and is otherwise without complaint today.   Past Medical History:  Diagnosis Date   A-fib ([place])    on Eliquis, flecainide and metoprolol   Diverticulitis    Hypercholesteremia    Hypertension    Low back pain    recurrent   OSA (obstructive sleep apnea)    Mild   Past Surgical History:  Procedure Laterality Date   ATRIAL FIBRILLATION ABLATION N/A 09/24/2021   Procedure: ATRIAL FIBRILLATION ABLATION;  Surgeon: Thompson Grayer, MD;  Location: [facility] [facility];  Service: Cardiovascular;  Laterality: N/A;   BICEPT TENODESIS Right 08/22/2021   Procedure: OPEN BICEPS TENODESIS;  Surgeon: Hiram Gash, MD;  Location:  [place];  Service: Orthopedics;  Laterality: Right;   CARDIOVERSION N/A 05/17/2021   Procedure: CARDIOVERSION;  Surgeon: Pixie Casino, MD;  Location: [facility];  Service: Cardiovascular;  Laterality: N/A;   LAPAROSCOPIC SIGMOID COLECTOMY     SHOULDER ARTHROSCOPY WITH SUBACROMIAL DECOMPRESSION Right 08/22/2021   Procedure: SHOULDER ARTHROSCOPY WITH SUBACROMIAL DECOMPRESSION;  Surgeon: Hiram Gash, MD;  Location: [place];  Service: Orthopedics;  Laterality: Right;    Current Outpatient Medications  Medication Sig Dispense Refill   amLODipine (NORVASC) 5 MG tablet Take 5 mg by mouth at bedtime.     apixaban (ELIQUIS) 5 MG TABS tablet Take 1 tablet (5 mg total) by mouth 2 (two) times daily. 60 tablet 5   atorvastatin (LIPITOR) 20 MG tablet Take 20 mg by mouth at bedtime.     Coenzyme Q10 (COQ-10 PO) Take 1 capsule by mouth daily. Qunol     flecainide (TAMBOCOR) 50 MG tablet Take 1 tablet (50 mg total) by mouth 2 (two) times daily. 180 tablet 2   furosemide (LASIX) 40 MG tablet Take 1 tablet (40 mg total) by mouth daily. 30 tablet 3   metoprolol succinate (TOPROL-XL) 50 MG 24 hr tablet Take 75 mg by mouth in the morning and at bedtime.     metoprolol tartrate (LOPRESSOR) 25 MG tablet Take 0.5 tablets (12.5 mg total) by mouth 2 (two) times daily as needed (for palpitations). 30 tablet 2   pantoprazole (  PROTONIX) 40 MG tablet Take 1 tablet (40 mg total) by mouth daily. 45 tablet 0   potassium chloride SA (KLOR-CON M) 20 MEQ tablet Take 2 tablets (40 mEq total) by mouth daily. 60 tablet 3   No current facility-administered medications for this encounter.    No Known Allergies  Social History   Socioeconomic History   Marital status: Married    Spouse name: Not on file   Number of children: Not on file   Years of education: Not on file   Highest education level: Not on file  Occupational History   Not on file  Tobacco Use   Smoking status: Every  Day    Packs/day: 1.00    Types: Cigarettes   Smokeless tobacco: Never   Tobacco comments:    Smokes 3/4-1ppd  Substance and Sexual Activity   Alcohol use: Yes    Alcohol/week: 1.0 - 2.0 standard drink    Types: 1 - 2 Glasses of wine per week    Comment: SOCIAL   Drug use: No   Sexual activity: Yes  Other Topics Concern   Not on file  Social History Narrative   Not on file   Social Determinants of Health   Financial Resource Strain: Not on file  Food Insecurity: Not on file  Transportation Needs: Not on file  Physical Activity: Not on file  Stress: Not on file  Social Connections: Not on file  Intimate Partner Violence: Not on file    Family History  Problem Relation Age of Onset   Heart attack Neg Hx     ROS- All systems are reviewed and negative except as per the HPI above  Physical Exam: Vitals:   10/22/21 0838  Weight: 95.9 kg  Height: 5\' 6"  (1.676 m)   Wt Readings from Last 3 Encounters:  10/22/21 95.9 kg  10/15/21 95.1 kg  09/24/21 89.4 kg    Labs: Lab Results  Component Value Date   NA 142 10/15/2021   K 4.1 10/15/2021   CL 96 10/15/2021   CO2 28 10/15/2021   GLUCOSE 105 (H) 10/15/2021   BUN 32 (H) 10/15/2021   CREATININE 1.53 (H) 10/15/2021   CALCIUM 10.1 10/15/2021   MG 2.3 10/15/2021   No results found for: INR No results found for: CHOL, HDL, LDLCALC, TRIG   GEN- The patient is well appearing, alert and oriented x 3 today.   Head- normocephalic, atraumatic Eyes-  Sclera clear, conjunctiva pink Ears- hearing intact Oropharynx- clear Neck- supple, no JVP Lymph- no cervical lymphadenopathy Lungs- Clear to ausculation bilaterally, normal work of breathing Heart- Regular rate and rhythm, no murmurs, rubs or gallops, PMI not laterally displaced GI- bloated , NT, ND, + BS,  Extremities- no clubbing, cyanosis, or  mild edema MS- no significant deformity or atrophy Skin- no rash or lesion Psych- euthymic mood, full affect Neuro-  strength and sensation are intact  EKG-Vent. rate 76 BPM PR interval 196 ms QRS duration 108 ms QT/QTcB 382/429 ms P-R-T axes 74 -35 81 Normal sinus rhythm Left axis deviation Inferior infarct , age undetermined    Assessment and Plan:  1. Afib S/p ablation 11/29 He is in SR Continue flecainide 50 mg  bid, metoprolol succinate 12.5 mg bid  No swallowing or groin issues He feels that he still has around 10 lbs of fluid Will increase lasix to 40 mg bid  Bmet today is pending  He will return on Thursday with repeat  bmet and weight  2. CHA2DS2VASc  score of 1 Continue eliquis 5 mg bid   Otherwise, f/u with Dr. Johney Frame 3 months s/p ablation    Domonique Brouillard [facility] [address] [place], [place] [address] [phone]

## 2021-10-22 NOTE — Patient Instructions (Signed)
Increase lasix to 40mg  twice a day until we see you Thursday

## 2021-10-24 ENCOUNTER — Encounter (HOSPITAL_COMMUNITY): Payer: Self-pay

## 2021-10-24 ENCOUNTER — Other Ambulatory Visit (HOSPITAL_COMMUNITY): Payer: No Typology Code available for payment source | Admitting: Nurse Practitioner

## 2021-10-25 ENCOUNTER — Telehealth (HOSPITAL_COMMUNITY): Payer: Self-pay | Admitting: *Deleted

## 2021-10-25 NOTE — Telephone Encounter (Signed)
Patient wife returning regarding upcoming cardiac imaging study; pt verbalizes understanding of appt date/time, parking situation and where to check in, and verified current allergies; name and call back number provided for further questions should they arise  Larey Brick RN Navigator Cardiac Imaging [facility] [phone] office [phone] cell  Patient denies metal but may have minor issues with claustrophobia.

## 2021-10-25 NOTE — Telephone Encounter (Signed)
Attempted to call patient regarding upcoming cardiac CT appointment. °Left message on voicemail with name and callback number ° °Constantino Starace RN Navigator Cardiac Imaging °[facility] °[phone] Office °[phone] Cell ° °

## 2021-10-29 ENCOUNTER — Ambulatory Visit (HOSPITAL_COMMUNITY)
Admission: RE | Admit: 2021-10-29 | Discharge: 2021-10-29 | Disposition: A | Discharge status: Home / Self Care | Payer: No Typology Code available for payment source | Source: Ambulatory Visit | Attending: Cardiology | Admitting: Cardiology

## 2021-10-29 ENCOUNTER — Ambulatory Visit (HOSPITAL_COMMUNITY)
Admission: RE | Admit: 2021-10-29 | Discharge: 2021-10-29 | Disposition: A | Discharge status: Home / Self Care | Payer: No Typology Code available for payment source | Source: Ambulatory Visit | Attending: Nurse Practitioner | Admitting: Nurse Practitioner

## 2021-10-29 ENCOUNTER — Other Ambulatory Visit: Payer: Self-pay

## 2021-10-29 DIAGNOSIS — Q264 Anomalous pulmonary venous connection, unspecified: Secondary | ICD-10-CM | POA: Insufficient documentation

## 2021-10-29 LAB — BASIC METABOLIC PANEL
Anion gap: 9 (ref 5–15)
BUN: 22 mg/dL — ABNORMAL HIGH (ref 6–20)
CO2: 28 mmol/L (ref 22–32)
Calcium: 9.3 mg/dL (ref 8.9–10.3)
Chloride: 102 mmol/L (ref 98–111)
Creatinine, Ser: 1.1 mg/dL (ref 0.61–1.24)
GFR, Estimated: >60 mL/min (ref >60)
Glucose, Bld: 102 mg/dL — ABNORMAL HIGH (ref 70–99)
Potassium: 4.1 mmol/L (ref 3.5–5.1)
Sodium: 139 mmol/L (ref 135–145)

## 2021-10-29 MED ORDER — LORAZEPAM 2 MG/ML IJ SOLN
INTRAMUSCULAR | Status: AC
  Administered 2021-10-29: 1 mg via INTRAVENOUS
  Filled 2021-10-29: qty 1

## 2021-10-29 MED ORDER — GADOBUTROL 1 MMOL/ML IV SOLN
10.0000 mL | Freq: Once | INTRAVENOUS | Status: AC | PRN
  Administered 2021-10-29: 10 mL via INTRAVENOUS

## 2021-10-29 MED ORDER — LORAZEPAM 2 MG/ML IJ SOLN: 1.0000 mg | Freq: Once | INTRAMUSCULAR | Status: AC

## 2021-11-01 ENCOUNTER — Encounter: Payer: Self-pay | Admitting: Cardiology

## 2021-11-04 ENCOUNTER — Telehealth: Payer: Self-pay | Admitting: *Deleted

## 2021-11-04 DIAGNOSIS — Q264 Anomalous pulmonary venous connection, unspecified: Secondary | ICD-10-CM

## 2021-11-04 NOTE — Telephone Encounter (Signed)
Spoke with Jasmine December in [facility] Scheduling to endorse to her this referral. Jasmine December will go ahead and start working on this, and send needed health insurance information as well as demographics on this pt, to Dr. Berniece Andreas office with [facility].  They should be able to visualize any notes or OV notes we've done on the pt, via care everywhere in epic.

## 2021-11-04 NOTE — Progress Notes (Signed)
Cardiology Office Note:    Date:  11/06/2021   ID:  Christopher [name], DOB 1965/10/28, MRN [id number]  PCP:  Christopher Pillar, MD   [name] Providers Cardiologist:  Christopher Bergeron, MD {   Referring MD: Christopher Pillar, MD    History of Present Illness:    Christopher [name] is a 56 y.o. male with a hx of HTN and paroxysmal atrial fibrillation who presents to clinic for follow-up of his Afib.  Patient was previously seen in 2017 by Dr. Irish [name] for chest pain. He underwent ETT which was normal with no ischemia. TTE also obtained which revealed LVEF 55%, mild LVH, no significant valve disease.  Was seen in clinic 03/19/21 for symptomatic Afib. 3 day zio demonstrated persistent Afib throughout with average HR 106bpm. TTE 04/18/21 with LVEF 55-60%, normal RV, no significant valve disease. We increased his metop to 75mg  daily and changed xarelto to apixaban.  He followed up with Dr. Rayann [name] and underwent successful afib ablation on 09/24/21. Following his ablation, he developed abdominal bloating and lower extremity edema for which he was started on lasix. Last saw Dr. Curt [name] on 10/15/21 where he was continued on lasix. Then had follow-up with Christopher [name] where he contiued to look overloaded. His lasix was increased to 40mg  IV BID.  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 Follow-up CMR revealed mild RV dilation with moderate systolic dysfunction with Qp:Qs 1.3. He now returns to clinic for follow-up.  Today, the patient overall feels well. No chest pain, dyspnea on exertion, lightheadedness or dizziness. Wt is up about 8lbs since transitioning back down to lasix 40mg  daily. No orthopnea, PND but notes some abdominal bloating and early satiety. Also with trace LE edema.   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  Past Medical History:  Diagnosis Date   A-fib ([place])    on Eliquis, flecainide and metoprolol   Diverticulitis    Hypercholesteremia    Hypertension    Low back pain    recurrent   OSA (obstructive sleep apnea)    Mild    Past Surgical History:  Procedure Laterality Date   ATRIAL FIBRILLATION ABLATION N/A 09/24/2021   Procedure: ATRIAL FIBRILLATION ABLATION;  Surgeon: Christopher Grayer, MD;  Location: [place] [facility];  Service: Cardiovascular;  Laterality: N/A;   BICEPT TENODESIS Right 08/22/2021   Procedure: OPEN BICEPS TENODESIS;  Surgeon: Christopher Gash, MD;  Location: [place];  Service: Orthopedics;  Laterality: Right;   CARDIOVERSION N/A 05/17/2021   Procedure: CARDIOVERSION;  Surgeon: Christopher Casino, MD;  Location: [facility];  Service: Cardiovascular;  Laterality: N/A;   LAPAROSCOPIC SIGMOID COLECTOMY     SHOULDER ARTHROSCOPY WITH SUBACROMIAL DECOMPRESSION Right 08/22/2021   Procedure: SHOULDER ARTHROSCOPY WITH SUBACROMIAL DECOMPRESSION;  Surgeon: Christopher Gash, MD;  Location: [place];  Service: Orthopedics;  Laterality: Right;    Current Medications: Current Meds  Medication Sig   amLODipine (NORVASC) 5 MG tablet Take 5 mg by mouth at bedtime.   apixaban (ELIQUIS) 5 MG TABS tablet Take 1 tablet (5 mg total) by mouth 2 (two) times daily.   atorvastatin (LIPITOR) 20 MG tablet Take 20 mg by mouth at bedtime.   Coenzyme Q10 (COQ-10 PO) Take 1 capsule by mouth daily. Qunol   flecainide (TAMBOCOR) 50 MG tablet Take 1  tablet (50 mg total) by mouth 2 (two) times daily.   furosemide (LASIX) 40 MG tablet Take 40 mg twice daily x 3 days, then take 40 mg twice daily on Mon, Wed, and Fridays alernating 40 mg once daily on Tue, Thurs, and Saturdays.   metoprolol succinate (TOPROL-XL) 50 MG 24 hr tablet Take 75 mg by mouth in the morning and at bedtime.   potassium chloride SA (KLOR-CON M) 20 MEQ tablet Take 2 tablets (40 mEq total) by mouth  daily.   [DISCONTINUED] furosemide (LASIX) 40 MG tablet Take 1 tablet (40 mg total) by mouth daily.     Allergies:   Patient has no known allergies.   Social History   Socioeconomic History   Marital status: Married    Spouse name: Not on file   Number of children: Not on file   Years of education: Not on file   Highest education level: Not on file  Occupational History   Not on file  Tobacco Use   Smoking status: Every Day    Packs/day: 1.00    Types: Cigarettes   Smokeless tobacco: Never   Tobacco comments:    Smokes 3/4-1ppd  Substance and Sexual Activity   Alcohol use: Yes    Alcohol/week: 1.0 - 2.0 standard drink    Types: 1 - 2 Glasses of wine per week    Comment: SOCIAL   Drug use: No   Sexual activity: Yes  Other Topics Concern   Not on file  Social History Narrative   Not on file   Social Determinants of Health   Financial Resource Strain: Not on file  Food Insecurity: Not on file  Transportation Needs: Not on file  Physical Activity: Not on file  Stress: Not on file  Social Connections: Not on file     Family History: The patient's family history is negative for Heart attack.  ROS:   Please see the history of present illness.    Review of Systems  Constitutional:  Negative for chills and fever.  HENT:  Negative for sore throat.   Eyes:  Negative for blurred vision.  Respiratory:  Positive for shortness of breath.   Cardiovascular:  Positive for palpitations and leg swelling. Negative for chest pain, orthopnea, claudication and PND.  Gastrointestinal:  Negative for blood in stool, melena, nausea and vomiting.  Genitourinary:  Negative for hematuria.  Musculoskeletal:  Negative for falls.  Neurological:  Positive for dizziness. Negative for loss of consciousness.  Psychiatric/Behavioral:  Negative for substance abuse.    EKGs/Labs/Other Studies Reviewed:    The following studies were reviewed today: Cardiac MRA 10/29/21: FINDINGS: Limited  images of the lung fields showed no gross abnormalities.   Normal left ventricular size and wall thickness. Normal wall motion with EF 56%. Relative to the left ventricle, the right ventricle was mildly dilated with moderately reduced systolic function, EF [id number]. Normal left atrial size with mildly dilated right atrium. No ASD was visualized. The right upper pulmonary vein drains into the SVC. Trileaflet aortic valve with no stenosis, trivial aortic insufficiency. No significant mitral regurgitation noted.   First pass perfusion imaging looks normal with no defect.   On delayed enhancement imaging, there was no myocardial late gadolinium enhancement (LGE).   MEASUREMENTS: MEASUREMENTS LVEDV 68 mL   LVSV 38 mL   LVEF 56%   RVEDV 141 mL RVSV 43 mL RVEF 30%   Aortic forward volume 28 mL   Aortic regurgitant fraction 6%   Pulmonary forward volume  37 mL   Pulmonic regurgitant fraction 4%   Qp/Qs 1.3   IMPRESSION: 1.  Normal LV size and systolic function, EF 0000000.   2. Relative to the LV, the RV was mildly dilated with moderate systolic dysfunction, EF calculated to 30%.   3. The right upper pulmonary vein drains to the SVC (views not ideal, did not have MRA done). I do not see a definite atrial septal defect. If there is high concern for ASD, TEE would be the best way to visualize. Qp/Qs is mildlly elevated at 1.3.   4. No myocardial LGE, so no definitive evidence for prior MI, infiltrative disease, or myocarditis.   Cardiac CT 09/17/21:   IMPRESSION: 1. 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 The remainder of the pulmonary veins have normal drainage with measurements as above. There is an outpouching in the superior LA without clear connection to the right atrium, no clear ASD or contrast flow seen in these images.   2.  Mild enlargement of RA and RV.   3. There is no thrombus in the left atrial appendage.   4. The esophagus runs in the left atrial  midline and is not in proximity to any of the pulmonary vein ostia.   5. Normal coronary origin.   6. CAC score of 5 which is 65th percentile for age-, race-, and sex-matched controls.  Cardiac Monitor 04/05/21: Patch wear time was 3 days and 1 hour Patient was in continuous Afib throughout the monitoring period (100% burden) with HR ranging from 62-164 with averag HR 106 Rare ventricular ectopy No significant pauses  TTE 04/18/21: IMPRESSIONS     1. Left ventricular ejection fraction, by estimation, is 55 to 60%. The  left ventricle has normal function. The left ventricle has no regional  wall motion abnormalities. Left ventricular diastolic function could not  be evaluated.   2. Right ventricular systolic function is normal. The right ventricular  size is normal. There is normal pulmonary artery systolic pressure.   3. The mitral valve is normal in structure. Trivial mitral valve  regurgitation. No evidence of mitral stenosis.   4. The aortic valve is tricuspid. Aortic valve regurgitation is not  visualized. No aortic stenosis is present.   5. The inferior vena cava is normal in size with greater than 50%  respiratory variability, suggesting right atrial pressure of 3 mmHg.   Comparison(s): No significant change from prior study. 11/03/16 EF 55%. PA  pressure 79mmHg.   TTE 11/03/16: Study Conclusions  - Left ventricle: The cavity size was normal. Wall thickness was    increased in a pattern of mild LVH. The estimated ejection    fraction was 55%. Wall motion was normal; there were no regional    wall motion abnormalities. Left ventricular diastolic function    parameters were normal.  - Aortic valve: There was no stenosis.  - Mitral valve: There was trivial regurgitation.  - Right ventricle: The cavity size was normal. Systolic function    was normal.  - Right atrium: The atrium was mildly dilated.  - Tricuspid valve: Peak RV-RA gradient (S): 31 mm Hg.  - Pulmonary  arteries: PA peak pressure: 34 mm Hg (S).  - Inferior vena cava: The vessel was normal in size. The    respirophasic diameter changes were in the normal range (>= 50%),    consistent with normal central venous pressure.   ETT 12/05/16:  Blood pressure demonstrated a normal response to exercise. There was no ST  segment deviation noted during stress.   Good exercise capacity. Normal BP response to exertion. No ischemia.   EKG:  EKG is  ordered today.  The ekg ordered today demonstrates atrial fibrillation with HR 91  Recent Labs: 09/03/2021: Hemoglobin 16.4; Platelets 243 10/15/2021: Magnesium 2.3; NT-Pro BNP <36 10/29/2021: BUN 22; Creatinine, Ser 1.10; Potassium 4.1; Sodium 139  Recent Lipid Panel No results found for: CHOL, TRIG, HDL, CHOLHDL, VLDL, LDLCALC, LDLDIRECT   Risk Assessment/Calculations:    CHA2DS2-VASc Score = 1  {This indicates a 0.6% annual risk of stroke. The patient's score is based upon: CHF History: 0 HTN History: 1 Diabetes History: 0 Stroke History: 0 Vascular Disease History: 0 Age Score: 0 Gender Score: 0       Physical Exam:    VS:  BP 112/72    Pulse 65    Ht 5\' 6"  (1.676 m)    Wt 212 lb (96.2 kg)    SpO2 96%    BMI 34.22 kg/m     Wt Readings from Last 3 Encounters:  11/06/21 212 lb (96.2 kg)  10/29/21 204 lb (92.5 kg)  10/22/21 211 lb 6.4 oz (95.9 kg)     GEN:  Comfortable, NAD HEENT: Normal NECK: No JVD; No carotid bruits CARDIAC: Irregularly irregular, no murmurs, rubs, gallops RESPIRATORY:  Clear to auscultation without rales, wheezing or rhonchi  ABDOMEN: Soft, non-tender, non-distended MUSCULOSKELETAL:  Trace LE edema bilaterally. Warm SKIN: Warm and dry NEUROLOGIC:  Alert and oriented x 3 PSYCHIATRIC:  Normal affect   ASSESSMENT:    1. Acute on chronic diastolic heart failure ([place])   2. Acquired thrombophilia ([place])   3. Persistent atrial fibrillation ([place])   4. Anomalous pulmonary venous return   5. Coronary artery  disease involving native coronary artery of native heart without angina pectoris   6. Medication management     PLAN:    In order of problems listed above:  #Acute on Chronic Diastolic HF: Wt up 8lbs since last visit. Has some abdominal bloating and LE edema but no orthopnea, PND, chest pain or SOB. Will increase his lasix and discussed the importance of watching daily weights and increasing lasix dosing as needed for weight gain. -Take lasix 40mg  PO BID x 3days, then lasix 40mg  daily Tu, Th, Sat, Sun and BID M, W, F -Will check BMET today and if okay, will add spironolactone 12.5mg  daily -Continue potassium 47mEq daily -Continue metoprolol 75mg  XL daily -Low Na diet -Monitor daily weights and take additional lasix if gains >2-3lbs in 2-3 days -Check BMET next week on the lasix  #Anomalous RUPV return: Noted on CTA obtained prior to Afib ablation. Follow-up CMR with mildly dilated RV with moderate systolic dysfunction and Qp:Qs 1.3. No ASD visualized. Other veins with normal venous return.  -Referred  to Dr. Raul [name] as patient may need closure given evidence of RV dysfunction  #Paroxysmal Afib: #Palpitations: CHADs-vasc 1. Now s/p Afib ablation on 08/2021. Doing well without palpitations -Continue apixaban 5mg  BID -Continue metop 75mg  XL daily -Continue flecainide 50mg  BID -Followed by Afib clinic  #HTN: Well controlled and at goal <120s/80s. -Continue amlodipine 10mg  daily -Continue metop 75mg  XL daily  #HLD: #Coronary Calcification: LDL 113 on 02/26/21. Ca score 5 (65% age, race and gender). -Continue lipitor 20mg  daily -Continue lifestyle modifications   Medication Adjustments/Labs and Tests Ordered: Current medicines are reviewed at length with the patient today.  Concerns regarding medicines are outlined above.  Orders Placed This Encounter  Procedures   Basic  metabolic panel   Magnesium   Basic metabolic panel   Magnesium   Meds ordered this encounter   Medications   furosemide (LASIX) 40 MG tablet    Sig: Take 40 mg twice daily x 3 days, then take 40 mg twice daily on Mon, Wed, and Fridays alernating 40 mg once daily on Tue, Thurs, and Saturdays.    Dispense:  42 tablet    Refill:  0     Patient Instructions  Medication Instructions:   START TAKING LASIX 40 MG BY MOUTH TWICE DAILY FOR 3 DAYS ONLY, THEN TAKE 40 MG BY MOUTH TWICE DAILY ON MONDAYS, [place], AND FRIDAYS, THEN ALTERNATE TAKING 40 MG BY MOUTH ONCE DAILY ON [place], [place], AND SATURDAYS.   *If you need a refill on your cardiac medications before your next appointment, please call your pharmacy*   Lab Work:  TODAY--BMET AND MAGNESIUM LEVEL  IN ONE WEEK--BMET AND MAGNESIUM LEVEL  If you have labs (blood work) drawn today and your tests are completely normal, you will receive your results only by: [place] (if you have MyChart) OR A paper copy in the mail If you have any lab test that is abnormal or we need to change your treatment, we will call you to review the results.    Follow-Up:  3 MONTHS WITH DR. Johney Frame OR AN EXTENDER IF NO AVAILABILITY       Signed, Christopher Bergeron, MD  11/06/2021 9:14 AM    [place]

## 2021-11-04 NOTE — Telephone Encounter (Signed)
As indicated in this message per Dr. 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  Referral placed and will send our [facility] dept to call and help arrange this appt for the pt.  Pt will see Dr. Shari Prows as planned on 1/11 and both pt and wife aware of this plan, for Dr. Ricard Dillon them with his MRI results and recommendations.    Referral to Dr. Cristy Folks with [facility] congenital placed in the system for this pt to see him for abnormal Cardiac MRI showing anomalous pulmonary venus return.  Will send a staff message to [facility] help coordinate this and will provide Dr. Verdie Shire email address, as included in this message.

## 2021-11-04 NOTE — Telephone Encounter (Signed)
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 Can we get this patient in with him? He said we can just email him at [email or web address] with demographics and patient contact information and they will call to schedule him.   Thank you and miss your face!  -Nira Conn

## 2021-11-04 NOTE — Telephone Encounter (Signed)
referral to Dr. Meredeth Ide Received: Today Omar Person B  Referral faxed to [phone]/ office @ [phone]

## 2021-11-06 ENCOUNTER — Other Ambulatory Visit: Payer: Self-pay

## 2021-11-06 ENCOUNTER — Ambulatory Visit (INDEPENDENT_AMBULATORY_CARE_PROVIDER_SITE_OTHER): Payer: No Typology Code available for payment source | Admitting: Cardiology

## 2021-11-06 ENCOUNTER — Encounter: Payer: Self-pay | Admitting: Cardiology

## 2021-11-06 VITALS — BP 112/72 | HR 65 | Ht 66.0 in | Wt 212.0 lb

## 2021-11-06 DIAGNOSIS — I5033 Acute on chronic diastolic (congestive) heart failure: Secondary | ICD-10-CM

## 2021-11-06 DIAGNOSIS — D6869 Other thrombophilia: Secondary | ICD-10-CM

## 2021-11-06 DIAGNOSIS — I4819 Other persistent atrial fibrillation: Secondary | ICD-10-CM | POA: Diagnosis not present

## 2021-11-06 DIAGNOSIS — I2510 Atherosclerotic heart disease of native coronary artery without angina pectoris: Secondary | ICD-10-CM

## 2021-11-06 DIAGNOSIS — Q264 Anomalous pulmonary venous connection, unspecified: Secondary | ICD-10-CM

## 2021-11-06 DIAGNOSIS — Z79899 Other long term (current) drug therapy: Secondary | ICD-10-CM

## 2021-11-06 LAB — BASIC METABOLIC PANEL
BUN/Creatinine Ratio: 23 — ABNORMAL HIGH (ref 9–20)
BUN: 25 mg/dL — ABNORMAL HIGH (ref 6–24)
CO2: 25 mmol/L (ref 20–29)
Calcium: 10 mg/dL (ref 8.7–10.2)
Chloride: 100 mmol/L (ref 96–106)
Creatinine, Ser: 1.11 mg/dL (ref 0.76–1.27)
Glucose: 102 mg/dL — ABNORMAL HIGH (ref 70–99)
Potassium: 4.5 mmol/L (ref 3.5–5.2)
Sodium: 137 mmol/L (ref 134–144)
eGFR: 78 mL/min/{1.73_m2} (ref >59)

## 2021-11-06 LAB — MAGNESIUM: Magnesium: 2.1 mg/dL (ref 1.6–2.3)

## 2021-11-06 MED ORDER — FUROSEMIDE 40 MG PO TABS: ORAL_TABLET | ORAL | 0 refills | Status: DC

## 2021-11-06 NOTE — Patient Instructions (Addendum)
Medication Instructions:   START TAKING LASIX 40 MG BY MOUTH TWICE DAILY FOR 3 DAYS ONLY, THEN TAKE 40 MG BY MOUTH TWICE DAILY ON MONDAYS, WEDNESDAYS, AND FRIDAYS, THEN ALTERNATE TAKING 40 MG BY MOUTH ONCE DAILY ON TUESDAYS, THURSDAYS, AND SATURDAYS.   *If you need a refill on your cardiac medications before your next appointment, please call your pharmacy*   Lab Work:  TODAY--BMET AND MAGNESIUM LEVEL  IN ONE WEEK--BMET AND MAGNESIUM LEVEL  If you have labs (blood work) drawn today and your tests are completely normal, you will receive your results only by: MyChart Message (if you have MyChart) OR A paper copy in the mail If you have any lab test that is abnormal or we need to change your treatment, we will call you to review the results.    Follow-Up:  3 MONTHS WITH DR. Shari Prows OR AN EXTENDER IF NO AVAILABILITY

## 2021-11-07 ENCOUNTER — Telehealth: Payer: Self-pay | Admitting: *Deleted

## 2021-11-07 MED ORDER — SPIRONOLACTONE 25 MG PO TABS: 12.5000 mg | ORAL_TABLET | Freq: Every day | ORAL | 2 refills | Status: DC

## 2021-11-07 NOTE — Telephone Encounter (Signed)
The patients wife Christopher [name] (on [place]) has been notified of the pts result and verbalized understanding.  All questions (if any) were answered. Christopher [name] is aware that per Dr. Shari Prows, she would like to add spironolactone 12.5 mg po daily to his regimen and he should come in for repeat labs as scheduled for 11/12/21. Confirmed the pharmacy of choice with the pts wife.  Wife verbalized understanding and agrees with this plan.

## 2021-11-07 NOTE — Telephone Encounter (Signed)
-----   Message from Heather E Pemberton, MD sent at 11/06/2021  5:25 PM EST ----- °Electrolytes and kidney function look stable. Can we start him on spironolactone 12.5mg daily? He has repeat labs next week.  °

## 2021-11-07 NOTE — Telephone Encounter (Signed)
-----   Message from [name], MD sent at 11/06/2021  5:25 PM EST ----- Electrolytes and kidney function look stable. Can we start him on spironolactone 12.5mg  daily? He has repeat labs next week.

## 2021-11-12 ENCOUNTER — Other Ambulatory Visit: Payer: Self-pay

## 2021-11-12 ENCOUNTER — Other Ambulatory Visit: Payer: No Typology Code available for payment source | Admitting: *Deleted

## 2021-11-12 DIAGNOSIS — D6869 Other thrombophilia: Secondary | ICD-10-CM

## 2021-11-12 DIAGNOSIS — I2510 Atherosclerotic heart disease of native coronary artery without angina pectoris: Secondary | ICD-10-CM

## 2021-11-12 DIAGNOSIS — I5033 Acute on chronic diastolic (congestive) heart failure: Secondary | ICD-10-CM

## 2021-11-12 DIAGNOSIS — I4819 Other persistent atrial fibrillation: Secondary | ICD-10-CM

## 2021-11-12 DIAGNOSIS — Q264 Anomalous pulmonary venous connection, unspecified: Secondary | ICD-10-CM

## 2021-11-12 DIAGNOSIS — Z79899 Other long term (current) drug therapy: Secondary | ICD-10-CM

## 2021-11-12 LAB — BASIC METABOLIC PANEL
BUN/Creatinine Ratio: 18 (ref 9–20)
BUN: 21 mg/dL (ref 6–24)
CO2: 25 mmol/L (ref 20–29)
Calcium: 9.9 mg/dL (ref 8.7–10.2)
Chloride: 102 mmol/L (ref 96–106)
Creatinine, Ser: 1.16 mg/dL (ref 0.76–1.27)
Glucose: 113 mg/dL — ABNORMAL HIGH (ref 70–99)
Potassium: 4.3 mmol/L (ref 3.5–5.2)
Sodium: 141 mmol/L (ref 134–144)
eGFR: 74 mL/min/{1.73_m2} (ref >59)

## 2021-11-12 LAB — MAGNESIUM: Magnesium: 2.1 mg/dL (ref 1.6–2.3)

## 2021-11-15 NOTE — Telephone Encounter (Signed)
Pt will see Dr. Cristy Folks with [facility], on next Thursday 11/21/21.  This appt date was confirmed with the pts wife [name].

## 2021-12-02 ENCOUNTER — Encounter: Payer: Self-pay | Admitting: Cardiology

## 2021-12-02 MED ORDER — METOPROLOL SUCCINATE ER 50 MG PO TB24: 75.0000 mg | ORAL_TABLET | Freq: Two times a day (BID) | ORAL | 2 refills | Status: DC

## 2021-12-02 MED ORDER — FUROSEMIDE 40 MG PO TABS: ORAL_TABLET | ORAL | 3 refills | Status: DC

## 2021-12-02 NOTE — Telephone Encounter (Signed)
Dr. Shari Prows, I sent in his lasix refill as we advised him to do at last OV on 11/06/21.  He is now suppose to take lasix as so: take 40 mg twice daily on Mon, Wed, and Fridays alernating 40 mg once daily on Tue, Thurs, and Saturdays.  Pts wife confirmed he is taking this as prescribed.  Pt is also requesting refill of his Toprol XL which he is reporting he takes Toprol XL 75 mg po bid, but according to your note on last OV from 11/06/21, you wanted him to take Toprol XL 75 mg po daily not BID.  Please clarify which dose of Toprol he should be taking, then send this back to me so I can refill this accordingly, for the pt.  Thanks!

## 2021-12-02 NOTE — Telephone Encounter (Signed)
Spoke with Dr. Johney Frame and clarified dosing of this pts Toprol XL.  Pt is suppose to be taking Toprol XL 75 mg po in the morning and at bedtime.  So pt is to be taking Toprol XL 75 mg po bid. Sent in refill of this script to the pts confirmed pharmacy on file.  Wife and pt aware that both his lasix and toprol XL was sent into his pharmacy for refill, via mychart message.

## 2021-12-25 ENCOUNTER — Other Ambulatory Visit: Payer: Self-pay | Admitting: Family Medicine

## 2021-12-25 DIAGNOSIS — R609 Edema, unspecified: Secondary | ICD-10-CM

## 2022-01-02 ENCOUNTER — Ambulatory Visit
Admission: RE | Admit: 2022-01-02 | Discharge: 2022-01-02 | Disposition: A | Discharge status: Home / Self Care | Payer: No Typology Code available for payment source | Source: Ambulatory Visit | Attending: Family Medicine | Admitting: Family Medicine

## 2022-01-02 DIAGNOSIS — R609 Edema, unspecified: Secondary | ICD-10-CM

## 2022-01-22 ENCOUNTER — Encounter: Payer: Self-pay | Admitting: Podiatry

## 2022-01-22 ENCOUNTER — Ambulatory Visit (INDEPENDENT_AMBULATORY_CARE_PROVIDER_SITE_OTHER): Payer: No Typology Code available for payment source | Admitting: Podiatry

## 2022-01-22 ENCOUNTER — Ambulatory Visit (INDEPENDENT_AMBULATORY_CARE_PROVIDER_SITE_OTHER): Payer: No Typology Code available for payment source

## 2022-01-22 DIAGNOSIS — M79671 Pain in right foot: Secondary | ICD-10-CM | POA: Diagnosis not present

## 2022-01-22 DIAGNOSIS — M79672 Pain in left foot: Secondary | ICD-10-CM

## 2022-01-22 DIAGNOSIS — M722 Plantar fascial fibromatosis: Secondary | ICD-10-CM

## 2022-01-22 MED ORDER — TRIAMCINOLONE ACETONIDE 10 MG/ML IJ SUSP
20.0000 mg | Freq: Once | INTRAMUSCULAR | Status: AC
  Administered 2022-01-22: 20 mg

## 2022-01-22 NOTE — Patient Instructions (Signed)

## 2022-01-23 NOTE — Progress Notes (Signed)
Subjective:  ? ?Patient ID: Christopher [name], male   DOB: 56 y.o.   MRN: [id number]  ? ?HPI ?Patient presents with severe heel pain right over left.  States its been present for years he had injections done a number years ago and its intensified over the last 6 months.  Patient is also having heart issues with a history of A-fib and abnormal vein lung connection which may need correction in the near future.  Patient does smoke a pack of cigarettes a day and tries to be active especially at work walking on cement floors ? ? ?Review of Systems  ?All other systems reviewed and are negative. ? ? ?   ?Objective:  ?Physical Exam ?Vitals and nursing note reviewed.  ?Constitutional:   ?   Appearance: He is well-developed.  ?Pulmonary:  ?   Effort: Pulmonary effort is normal.  ?Musculoskeletal:     ?   General: Normal range of motion.  ?Skin: ?   General: Skin is warm.  ?Neurological:  ?   Mental Status: He is alert.  ?  ?Neurovascular status intact muscle strength found to be adequate range of motion within normal limits.  Patient is found to have exquisite discomfort in the right plantar fascia at the insertion calcaneus moderate in the left with moderate depression of the arch noted bilateral.  Patient is found to have good digital perfusion well oriented x3 ? ?   ?Assessment:  ?Acute plantar fasciitis bilateral with patient also having moderate swelling related to his heart issues which may be putting more stress on the plantar fascia along with moderate flatfoot deformity ? ?   ?Plan:  ?H&P reviewed condition and today I went ahead did sterile prep and injected the plantar fascia at insertion bilateral 3 mg Kenalog 5 mg Xylocaine applied fascial brace to lift up the arch gave instructions on physical therapy support therapy and did reviewed the fascial brace and its utilization to offload the heel.  Patient will be seen back in the next few weeks he tried orthotics in the past and we may have to try to make him a new pair  hopefully he will have good response but I do not see him as a surgical candidate in the near future until his heart issues are resolved ? ?X-rays indicate small spur no indication stress fracture arthritis with moderate depression of the arch ?   ? ? ?

## 2022-01-27 ENCOUNTER — Other Ambulatory Visit: Payer: Self-pay

## 2022-01-27 ENCOUNTER — Other Ambulatory Visit: Payer: Self-pay | Admitting: Podiatry

## 2022-01-27 DIAGNOSIS — M722 Plantar fascial fibromatosis: Secondary | ICD-10-CM

## 2022-01-27 MED ORDER — POTASSIUM CHLORIDE CRYS ER 20 MEQ PO TBCR: 40.0000 meq | EXTENDED_RELEASE_TABLET | Freq: Every day | ORAL | 10 refills | Status: DC

## 2022-02-02 NOTE — Progress Notes (Deleted)
?Cardiology Office Note:   ? ?Date:  02/02/2022  ? ?ID:  [name], DOB [birth year], MRN [id number] ? ?PCP:  [name], MD ?  ?[facility] Providers ?Cardiologist:  [name], MD {  ? ?Referring MD: [name], MD  ? ? ?History of Present Illness:   ? ?Christopher [name] is a 56 y.o. male with a hx of HTN and paroxysmal atrial fibrillation who presents to clinic for follow-up.  ? ?Patient was previously seen in 2017 by [name] for chest pain. He underwent ETT which was normal with no ischemia. TTE also obtained which revealed LVEF 55%, mild LVH, no significant valve disease. ? ?Was seen in clinic 03/19/21 for symptomatic Afib. 3 day zio demonstrated persistent Afib throughout with average HR 106bpm. TTE 04/18/21 with LVEF 55-60%, normal RV, no significant valve disease. We increased his metop to 75mg  daily and changed xarelto to apixaban. ? ?He followed up with Dr. and underwent successful afib ablation on 09/24/21. Following his ablation, he developed abdominal bloating and lower extremity edema for which he was started on lasix. Last saw Dr. 09/26/21 on 10/15/21 where he was continued on lasix. Then had follow-up with 10/17/21 where he contiued to look overloaded. His lasix was increased to 40mg  PO BID. ? 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 Follow-up CMR revealed mild RV dilation with moderate systolic dysfunction with Qp:Qs 1.3. He now returns to clinic for follow-up. ? ?Saw Dr. [name] and underwent RHC 01/06/22 which showed Qp:Qs 1.7 as well as moderately elevated PCWP. Cath with mild, nonobstructive disease.  ? ?Today, *** ? ?Past Medical History:  ?Diagnosis Date  ? A-fib (HCC)   ? on Eliquis, flecainide and metoprolol  ? Diverticulitis   ? Hypercholesteremia   ? Hypertension   ? Low back pain   ? recurrent  ? OSA (obstructive sleep apnea)   ? Mild  ? ? ?Past  Surgical History:  ?Procedure Laterality Date  ? ATRIAL FIBRILLATION ABLATION N/A 09/24/2021  ? Procedure: ATRIAL FIBRILLATION ABLATION;  Surgeon: 01/08/22, MD;  Location: [facility];  Service: Cardiovascular;  Laterality: N/A;  ? BICEPT TENODESIS Right 08/22/2021  ? Procedure: OPEN BICEPS TENODESIS;  Surgeon: [name], MD;  Location: [facility];  Service: Orthopedics;  Laterality: Right;  ? CARDIOVERSION N/A 05/17/2021  ? Procedure: CARDIOVERSION;  Surgeon: [name], MD;  Location[facility];  Service: Cardiovascular;  Laterality: N/A;  ? LAPAROSCOPIC SIGMOID COLECTOMY    ? SHOULDER ARTHROSCOPY WITH SUBACROMIAL DECOMPRESSION Right 08/22/2021  ? Procedure: SHOULDER ARTHROSCOPY WITH SUBACROMIAL DECOMPRESSION;  Surgeon: [name], MD;  Location: [facility];  Service: Orthopedics;  Laterality: Right;  ? ? ?Current Medications: ?No outpatient medications have been marked as taking for the 02/07/22 encounter (Appointment) with [name], MD.  ?  ? ?Allergies:   Patient has no known allergies.  ? ?Social History  ? ?Socioeconomic History  ? Marital status: Married  ?  Spouse name: Not on file  ? Number of children: Not on file  ? Years of education: Not on file  ? Highest education level: Not on file  ?Occupational History  ? Not on file  ?Tobacco Use  ? Smoking status: Every Day  ?  Packs/day: 1.00  ?  Types: Cigarettes  ? Smokeless tobacco: Never  ? Tobacco comments:  ?  Smokes  3/4-1ppd  ?Substance and Sexual Activity  ? Alcohol use: Yes  ?  Alcohol/week: 1.0 - 2.0 standard drink  ?  Types: 1 - 2 Glasses of wine per week  ?  Comment: SOCIAL  ? Drug use: No  ? Sexual activity: Yes  ?Other Topics Concern  ? Not on file  ?Social History Narrative  ? Not on file  ? ?Social Determinants of Health  ? ?Financial Resource Strain: Not on file  ?Food Insecurity: Not on file  ?Transportation Needs: Not on file  ?Physical Activity: Not on file  ?Stress: Not on  file  ?Social Connections: Not on file  ?  ? ?Family History: ?The patient's family history is negative for Heart attack. ? ?ROS:   ?Please see the history of present illness.    ?Review of Systems  ?Constitutional:  Negative for chills and fever.  ?HENT:  Negative for sore throat.   ?Eyes:  Negative for blurred vision.  ?Respiratory:  Positive for shortness of breath.   ?Cardiovascular:  Positive for palpitations and leg swelling. Negative for chest pain, orthopnea, claudication and PND.  ?Gastrointestinal:  Negative for blood in stool, melena, nausea and vomiting.  ?Genitourinary:  Negative for hematuria.  ?Musculoskeletal:  Negative for falls.  ?Neurological:  Positive for dizziness. Negative for loss of consciousness.  ?Psychiatric/Behavioral:  Negative for substance abuse.   ? ?EKGs/Labs/Other Studies Reviewed:   ? ?The following studies were reviewed today: ?[facility] ?Impressions:  ? ?Upper limits of normal right heart pressures with preserved cardiac output and moderately elevated pulmonary capillary wedge pressure. 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 This vessel was  ?cannulated and a selective angiogram performed. The calculated Qp:Qs was 1.7. Coronary angiography showed mild, hemodynamically insignificant coronary disease.  ? ?Recommendations:  ? ?Will review patient data with Dr. Meredeth IdeFleming and at [facility] on Friday to help decide the need for an operative intervention.  ?Cardiac MRA 10/29/21: ?FINDINGS: ?Limited images of the lung fields showed no gross abnormalities. ?  ?Normal left ventricular size and wall thickness. Normal wall motion ?with EF 56%. Relative to the left ventricle, the right ventricle was ?mildly dilated with moderately reduced systolic function, EF 30%. ?Normal left atrial size with mildly dilated right atrium. No ASD was ?visualized. The right upper pulmonary vein drains into the  SVC. ?Trileaflet aortic valve with no stenosis, trivial aortic ?insufficiency. No significant mitral regurgitation noted. ?  ?First pass perfusion imaging looks normal with no defect. ?  ?On delayed enhancement imaging, there was no myocardial late ?gadolinium enhancement (LGE). ?  ?MEASUREMENTS: ?MEASUREMENTS ?LVEDV 68 mL ?  ?LVSV 38 mL ?  ?LVEF 56% ?  ?RVEDV 141 mL ?RVSV 43 mL ?RVEF 30% ?  ?Aortic forward volume 28 mL ?  ?Aortic regurgitant fraction 6% ?  ?Pulmonary forward volume 37 mL ?  ?Pulmonic regurgitant fraction 4% ?  ?Qp/Qs 1.3 ?  ?IMPRESSION: ?1.  Normal LV size and systolic function, EF 56%. ?  ?2. Relative to the LV, the RV was mildly dilated with moderate ?systolic dysfunction, EF calculated to 30%. ?  ?3. The right upper pulmonary vein drains to the SVC (views not ?ideal, did not have MRA done). I do not see a definite atrial septal ?defect. If there is high concern for ASD, TEE would be the best way ?to visualize. Qp/Qs is mildlly elevated at 1.3. ?  ?4. No myocardial LGE, so no definitive evidence for prior  MI, ?infiltrative disease, or myocarditis. ?  ?Cardiac CT 09/17/21: ?  ?IMPRESSION: ?1. 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 The remainder of ?the pulmonary veins have normal drainage with measurements as above. ?There is an outpouching in the superior LA without clear connection ?to the right atrium, no clear ASD or contrast flow seen in these ?images. ?  ?2.  Mild enlargement of RA and RV. ?  ?3. There is no thrombus in the left atrial appendage. ?  ?4. The esophagus runs in the left atrial midline and is not in ?proximity to any of the pulmonary vein ostia. ?  ?5. Normal coronary origin. ?  ?6. CAC score of 5 which is 65th percentile for age-, race-, and ?sex-matched controls. ? ?Cardiac Monitor 04/05/21: ?Patch wear time was 3 days and 1 hour ?Patient was in continuous Afib throughout the monitoring period (100% burden) with HR ranging from 62-164 with averag HR 106 ?Rare  ventricular ectopy ?No significant pauses ? ?TTE 04/18/21: ?IMPRESSIONS  ? ? ? 1. Left ventricular ejection fraction, by estimation, is 55 to 60%. The  ?left ventricle has normal function. The left ventricle has no r

## 2022-02-05 ENCOUNTER — Other Ambulatory Visit: Payer: Self-pay | Admitting: Internal Medicine

## 2022-02-05 DIAGNOSIS — R002 Palpitations: Secondary | ICD-10-CM

## 2022-02-05 DIAGNOSIS — I4891 Unspecified atrial fibrillation: Secondary | ICD-10-CM

## 2022-02-05 NOTE — Telephone Encounter (Signed)
Pt last saw Dr Shari Prows 11/06/21, last labs 11/12/21 Creat 1.16, age 56, weight 96.2kg, based on specified criteria pt is on appropriate dosage of Eliquis 5mg  BID for afib.  Will refill rx.  ?

## 2022-02-07 ENCOUNTER — Ambulatory Visit (INDEPENDENT_AMBULATORY_CARE_PROVIDER_SITE_OTHER): Payer: No Typology Code available for payment source | Admitting: Cardiology

## 2022-02-07 ENCOUNTER — Encounter: Payer: Self-pay | Admitting: Cardiology

## 2022-02-07 VITALS — BP 102/70 | HR 61 | Ht 66.0 in | Wt 208.8 lb

## 2022-02-07 DIAGNOSIS — I2510 Atherosclerotic heart disease of native coronary artery without angina pectoris: Secondary | ICD-10-CM

## 2022-02-07 DIAGNOSIS — I5032 Chronic diastolic (congestive) heart failure: Secondary | ICD-10-CM

## 2022-02-07 DIAGNOSIS — Q264 Anomalous pulmonary venous connection, unspecified: Secondary | ICD-10-CM

## 2022-02-07 DIAGNOSIS — D6869 Other thrombophilia: Secondary | ICD-10-CM

## 2022-02-07 DIAGNOSIS — I4819 Other persistent atrial fibrillation: Secondary | ICD-10-CM | POA: Diagnosis not present

## 2022-02-07 NOTE — Patient Instructions (Signed)
Medication Instructions:  ? ?Your physician recommends that you continue on your current medications as directed. Please refer to the Current Medication list given to you today. ? ? ?*If you need a refill on your cardiac medications before your next appointment, please call your pharmacy* ? ? ?Follow-Up: ?At [facility], you and your health needs are our priority.  As part of our continuing mission to provide you with exceptional heart care, we have created designated Provider Care Teams.  These Care Teams include your primary Cardiologist (physician) and Advanced Practice Providers (APPs -  Physician Assistants and Nurse Practitioners) who all work together to provide you with the care you need, when you need it. ? ?We recommend signing up for the patient portal called "MyChart".  Sign up information is provided on this After Visit Summary.  MyChart is used to connect with patients for Virtual Visits (Telemedicine).  Patients are able to view lab/test results, encounter notes, upcoming appointments, etc.  Non-urgent messages can be sent to your provider as well.   ?To learn more about what you can do with MyChart, go to [email or web address].   ? ?Your next appointment:   ?3 month(s) ? ?The format for your next appointment:   ?In Person ? ?Provider:   ?[name], MD   ? ? ?Important Information About Sugar ? ? ? ? ?  ?

## 2022-02-07 NOTE — Progress Notes (Signed)
?Cardiology Office Note:   ? ?Date:  02/07/2022  ? ?ID:  Christopher [name], DOB 11-01-1965, MRN [id number] ? ?PCP:  Kelton Pillar, MD ?  ?[facility] Providers ?Cardiologist:  Freada Bergeron, MD {  ? ?Referring MD: Kelton Pillar, MD  ? ? ?History of Present Illness:   ? ?Christopher [name] is a 56 y.o. male with a hx of HTN, anomalous pulmonary venous return and paroxysmal atrial fibrillation who presents to clinic for follow-up.  ? ?Patient was previously seen in 2017 by Dr. Irish Lack for chest pain. He underwent ETT which was normal with no ischemia. TTE also obtained which revealed LVEF 55%, mild LVH, no significant valve disease. ? ?Was seen in clinic 03/19/21 for symptomatic Afib. 3 day zio demonstrated persistent Afib throughout with average HR 106bpm. TTE 04/18/21 with LVEF 55-60%, normal RV, no significant valve disease. We increased his metop to 75mg  daily and changed xarelto to apixaban. ? ?He followed up with Dr. Rayann Heman and underwent successful afib ablation on 09/24/21. Following his ablation, he developed abdominal bloating and lower extremity edema for which he was started on lasix. Last saw Dr. Curt Bears on 10/15/21 where he was continued on lasix. Then had follow-up with Roderic Palau where he contiued to look overloaded. His lasix was increased to 40mg  PO BID. ? 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 Follow-up CMR revealed mild RV dilation with moderate systolic dysfunction with Qp:Qs 1.3. He now returns to clinic for follow-up. ? ?Saw Dr. Vella Kohler and underwent [place] 01/06/22 which showed Qp:Qs 1.7 as well as moderately elevated PCWP. Cath with mild, nonobstructive disease.  ? ?Today, the patient states that he is doing good. He is accompanied today by a family member who also provides history.  He has been doing well from a CV standpoint. LE edema overall well controlled on lasix dosing. No  orthopnea, PND, palpitaitons, lightheadedness or dizziness. Has been tolerating medications without issues. No bleeding on apixaban. Continues to have low energy which he attributes to working overnight as well as poor sleep.  ? ?Blood pressure is low 100s today. Does not monitor at home. Denies orthostatic symptoms.  ? ?Past Medical History:  ?Diagnosis Date  ? A-fib ([place])   ? on Eliquis, flecainide and metoprolol  ? Diverticulitis   ? Hypercholesteremia   ? Hypertension   ? Low back pain   ? recurrent  ? OSA (obstructive sleep apnea)   ? Mild  ? ? ?Past Surgical History:  ?Procedure Laterality Date  ? ATRIAL FIBRILLATION ABLATION N/A 09/24/2021  ? Procedure: ATRIAL FIBRILLATION ABLATION;  Surgeon: Thompson Grayer, MD;  Location: [place] [facility];  Service: Cardiovascular;  Laterality: N/A;  ? BICEPT TENODESIS Right 08/22/2021  ? Procedure: OPEN BICEPS TENODESIS;  Surgeon: Hiram Gash, MD;  Location: [place];  Service: Orthopedics;  Laterality: Right;  ? CARDIOVERSION N/A 05/17/2021  ? Procedure: CARDIOVERSION;  Surgeon: Pixie Casino, MD;  Location: [place];  Service: Cardiovascular;  Laterality: N/A;  ? LAPAROSCOPIC SIGMOID COLECTOMY    ? SHOULDER ARTHROSCOPY WITH SUBACROMIAL DECOMPRESSION Right 08/22/2021  ? Procedure: SHOULDER ARTHROSCOPY WITH SUBACROMIAL DECOMPRESSION;  Surgeon: Hiram Gash, MD;  Location: [place];  Service: Orthopedics;  Laterality: Right;  ? ? ?Current Medications: ?Current Meds  ?Medication Sig  ? amLODipine (NORVASC) 5 MG tablet Take 5 mg by mouth at bedtime.  ? atorvastatin (LIPITOR) 20  MG tablet Take 20 mg by mouth at bedtime.  ? Coenzyme Q10 (COQ-10 PO) Take 1 capsule by mouth daily. Qunol  ? ELIQUIS 5 MG TABS tablet TAKE 1 TABLET(5 MG) BY MOUTH TWICE DAILY  ? flecainide (TAMBOCOR) 50 MG tablet Take 1 tablet (50 mg total) by mouth 2 (two) times daily.  ? furosemide (LASIX) 40 MG tablet Take 40 mg twice daily on Mon, Wed, and Fridays  alernating 40 mg once daily on Tue, Thurs, and Saturdays.  ? metoprolol succinate (TOPROL-XL) 50 MG 24 hr tablet Take 1.5 tablets (75 mg total) by mouth in the morning and at bedtime.  ? potassium chloride SA (KLOR-CON M) 20 MEQ tablet Take 2 tablets (40 mEq total) by mouth daily.  ? spironolactone (ALDACTONE) 25 MG tablet Take 0.5 tablets (12.5 mg total) by mouth daily.  ?  ? ?Allergies:   Patient has no known allergies.  ? ?Social History  ? ?Socioeconomic History  ? Marital status: Married  ?  Spouse name: Not on file  ? Number of children: Not on file  ? Years of education: Not on file  ? Highest education level: Not on file  ?Occupational History  ? Not on file  ?Tobacco Use  ? Smoking status: Every Day  ?  Packs/day: 1.00  ?  Types: Cigarettes  ? Smokeless tobacco: Never  ? Tobacco comments:  ?  Smokes 3/4-1ppd  ?Substance and Sexual Activity  ? Alcohol use: Yes  ?  Alcohol/week: 1.0 - 2.0 standard drink  ?  Types: 1 - 2 Glasses of wine per week  ?  Comment: SOCIAL  ? Drug use: No  ? Sexual activity: Yes  ?Other Topics Concern  ? Not on file  ?Social History Narrative  ? Not on file  ? ?Social Determinants of Health  ? ?Financial Resource Strain: Not on file  ?Food Insecurity: Not on file  ?Transportation Needs: Not on file  ?Physical Activity: Not on file  ?Stress: Not on file  ?Social Connections: Not on file  ?  ? ?Family History: ?The patient's family history is negative for Heart attack. ? ?ROS:   ?Please see the history of present illness.    ?Review of Systems  ?Constitutional:  Negative for chills and fever.  ?HENT:  Negative for sore throat.   ?Eyes:  Negative for blurred vision.  ?Respiratory:  Positive for cough, shortness of breath and wheezing.   ?Cardiovascular:  Positive for leg swelling. Negative for chest pain, palpitations, orthopnea, claudication and PND.  ?Gastrointestinal:  Negative for blood in stool, melena, nausea and vomiting.  ?Genitourinary:  Negative for hematuria.   ?Musculoskeletal:  Negative for falls.  ?Neurological:  Negative for dizziness and loss of consciousness.  ?Endo/Heme/Allergies:  Positive for environmental allergies.  ?Psychiatric/Behavioral:  Negative for substance abuse.   ? ?EKGs/Labs/Other Studies Reviewed:   ? ?The following studies were reviewed today: ? ? ?RHC/LHC at [facility] 12/2021: ?Impressions:  ? ?Upper limits of normal right heart pressures with preserved cardiac output and moderately elevated pulmonary capillary wedge pressure. 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 This vessel was  ?cannulated and a selective angiogram performed. The calculated Qp:Qs was 1.7. Coronary angiography showed mild, hemodynamically insignificant coronary disease.  ? ?Recommendations:  ? ?Will review patient data with Dr. Raul Del and at [facility] on Friday to help decide the need for an operative intervention.  ?Cardiac MRA 10/29/21: ?FINDINGS: ?Limited images of the lung fields  showed no gross abnormalities. ?  ?Normal left ventricular size and wall thickness. Normal wall motion ?with EF 56%. Relative to the left ventricle, the right ventricle was ?mildly dilated with moderat/ely reduced systolic function, EF [id number]. ?Normal left atrial size with mildly dilated right atrium. No ASD was ?visualized. The right upper pulmonary vein drains into the SVC. ?Trileaflet aortic valve with no stenosis, trivial aortic ?insufficiency. No significant mitral regurgitation noted. ?  ?First pass perfusion imaging looks normal with no defect. ?  ?On delayed enhancement imaging, there was no myocardial late ?gadolinium enhancement (LGE). ?  ?MEASUREMENTS: ?MEASUREMENTS ?LVEDV 68 mL ?  ?LVSV 38 mL ?  ?LVEF 56% ?  ?RVEDV 141 mL ?RVSV 43 mL ?RVEF 30% ?  ?Aortic forward volume 28 mL ?  ?Aortic regurgitant fraction 6% ?  ?Pulmonary forward volume 37 mL ?  ?Pulmonic regurgitant fraction 4% ?  ?Qp/Qs 1.3 ?   ?IMPRESSION: ?1.  Normal LV size and systolic function, EF 0000000. ?  ?2. Relative to the LV, the RV was mildly dilated with moderate ?systolic dysfunction, EF calculated to 30%. ?  ?3. The right upper pulmonary vein drains to the SVC (views

## 2022-02-12 ENCOUNTER — Ambulatory Visit: Payer: No Typology Code available for payment source | Admitting: Podiatry

## 2022-02-22 ENCOUNTER — Other Ambulatory Visit: Payer: Self-pay | Admitting: Physician Assistant

## 2022-02-26 ENCOUNTER — Ambulatory Visit: Payer: No Typology Code available for payment source | Admitting: Podiatry

## 2022-04-08 DIAGNOSIS — Z0279 Encounter for issue of other medical certificate: Secondary | ICD-10-CM

## 2022-04-19 ENCOUNTER — Encounter: Payer: Self-pay | Admitting: Cardiology

## 2022-04-20 NOTE — Progress Notes (Deleted)
Cardiology Office Note:    Date:  04/20/2022   ID:  Christopher [name], DOB 28-Apr-1966, MRN [id number]  PCP:  [name], MD   [facility] Providers Cardiologist:  [name], MD {   Referring MD: [name], MD    History of Present Illness:    Christopher [name] is a 56 y.o. male with a hx of HTN, anomalous pulmonary venous return and paroxysmal atrial fibrillation who presents to clinic for follow-up.   Patient was previously seen in 2017 by [name] for chest pain. He underwent ETT which was normal with no ischemia. TTE also obtained which revealed LVEF 55%, mild LVH, no significant valve disease.  Was seen in clinic 03/19/21 for symptomatic Afib. 3 day zio demonstrated persistent Afib throughout with average HR 106bpm. TTE 04/18/21 with LVEF 55-60%, normal RV, no significant valve disease. We increased his metop to 75mg  daily and changed xarelto to apixaban.  He followed up with Dr. and underwent successful afib ablation on 09/24/21. Following his ablation, he developed abdominal bloating and lower extremity edema for which he was started on lasix. Last saw Dr. 09/26/21 on 10/15/21 where he was continued on lasix. Then had follow-up with 10/17/21 where he contiued to look overloaded. His lasix was increased to 40mg  PO BID.  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 Follow-up CMR revealed mild RV dilation with moderate systolic dysfunction with Qp:Qs 1.3. He now returns to clinic for follow-up.  Saw Dr. [name] and underwent RHC 01/06/22 which showed Qp:Qs 1.7 as well as moderately elevated PCWP. Cath with mild, nonobstructive disease.   Today, the patient states that he is doing good. He is accompanied today by a family member who also provides history.  He has been doing well from a CV standpoint. LE edema overall well controlled on lasix dosing. No  orthopnea, PND, palpitaitons, lightheadedness or dizziness. Has been tolerating medications without issues. No bleeding on apixaban. Continues to have low energy which he attributes to working overnight as well as poor sleep.   Blood pressure is low 100s today. Does not monitor at home. Denies orthostatic symptoms.   Past Medical History:  Diagnosis Date   A-fib (HCC)    on Eliquis, flecainide and metoprolol   Diverticulitis    Hypercholesteremia    Hypertension    Low back pain    recurrent   OSA (obstructive sleep apnea)    Mild    Past Surgical History:  Procedure Laterality Date   ATRIAL FIBRILLATION ABLATION N/A 09/24/2021   Procedure: ATRIAL FIBRILLATION ABLATION;  Surgeon: 01/08/22, MD;  Location: [facility];  Service: Cardiovascular;  Laterality: N/A;   BICEPT TENODESIS Right 08/22/2021   Procedure: OPEN BICEPS TENODESIS;  Surgeon: [name], MD;  Location: [facility];  Service: Orthopedics;  Laterality: Right;   CARDIOVERSION N/A 05/17/2021   Procedure: CARDIOVERSION;  Surgeon: [name], MD;  Location: [facility];  Service: Cardiovascular;  Laterality: N/A;   LAPAROSCOPIC SIGMOID COLECTOMY     SHOULDER ARTHROSCOPY WITH SUBACROMIAL DECOMPRESSION Right 08/22/2021   Procedure: SHOULDER ARTHROSCOPY WITH SUBACROMIAL DECOMPRESSION;  Surgeon: [name], MD;  Location: [facility];  Service: Orthopedics;  Laterality: Right;    Current Medications: No outpatient medications have been marked as taking for the 05/02/22 encounter (Appointment) with [name], MD.     Allergies:  Patient has no known allergies.   Social History   Socioeconomic History   Marital status: Married    Spouse name: Not on file   Number of children: Not on file   Years of education: Not on file   Highest education level: Not on file  Occupational History   Not on file  Tobacco Use   Smoking status: Every Day    Packs/day: 1.00     Types: Cigarettes   Smokeless tobacco: Never   Tobacco comments:    Smokes 3/4-1ppd  Substance and Sexual Activity   Alcohol use: Yes    Alcohol/week: 1.0 - 2.0 standard drink of alcohol    Types: 1 - 2 Glasses of wine per week    Comment: SOCIAL   Drug use: No   Sexual activity: Yes  Other Topics Concern   Not on file  Social History Narrative   Not on file   Social Determinants of Health   Financial Resource Strain: Not on file  Food Insecurity: Not on file  Transportation Needs: Not on file  Physical Activity: Not on file  Stress: Not on file  Social Connections: Not on file     Family History: The patient's family history is negative for Heart attack.  ROS:   Please see the history of present illness.    Review of Systems  Constitutional:  Negative for chills and fever.  HENT:  Negative for sore throat.   Eyes:  Negative for blurred vision.  Respiratory:  Positive for cough, shortness of breath and wheezing.   Cardiovascular:  Positive for leg swelling. Negative for chest pain, palpitations, orthopnea, claudication and PND.  Gastrointestinal:  Negative for blood in stool, melena, nausea and vomiting.  Genitourinary:  Negative for hematuria.  Musculoskeletal:  Negative for falls.  Neurological:  Negative for dizziness and loss of consciousness.  Endo/Heme/Allergies:  Positive for environmental allergies.  Psychiatric/Behavioral:  Negative for substance abuse.     EKGs/Labs/Other Studies Reviewed:    The following studies were reviewed today:   RHC[facility]LHC at [facility] 12/2021: Impressions:   Upper limits of normal right heart pressures with preserved cardiac output and moderately elevated pulmonary capillary wedge pressure. 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 This vessel was  cannulated and a selective angiogram performed. The calculated Qp:Qs was 1.7. Coronary angiography showed mild, hemodynamically  insignificant coronary disease.   Recommendations:   Will review patient data with Dr. Meredeth Ide and at combined Cong[facility] Conference on Friday to help decide the need for an operative intervention.  Cardiac MRA 10/29/21: FINDINGS: Limited images of the lung fields showed no gross abnormalities.   Normal left ventricular size and wall thickness. Normal wall motion with EF 56%. Relative to the left ventricle, the right ventricle was mildly dilated with moderat/ely reduced systolic function, EF 30%. Normal left atrial size with mildly dilated right atrium. No ASD was visualized. The right upper pulmonary vein drains into the SVC. Trileaflet aortic valve with no stenosis, trivial aortic insufficiency. No significant mitral regurgitation noted.   First pass perfusion imaging looks normal with no defect.   On delayed enhancement imaging, there was no myocardial late gadolinium enhancement (LGE).   MEASUREMENTS: MEASUREMENTS LVEDV 68 mL   LVSV 38 mL   LVEF 56%   RVEDV 141 mL RVSV 43 mL RVEF 30%   Aortic forward volume 28 mL   Aortic regurgitant fraction 6%   Pulmonary forward volume 37 mL  Pulmonic regurgitant fraction 4%   Qp/Qs 1.3   IMPRESSION: 1.  Normal LV size and systolic function, EF 0000000.   2. Relative to the LV, the RV was mildly dilated with moderate systolic dysfunction, EF calculated to 30%.   3. The right upper pulmonary vein drains to the SVC (views not ideal, did not have MRA done). I do not see a definite atrial septal defect. If there is high concern for ASD, TEE would be the best way to visualize. Qp/Qs is mildlly elevated at 1.3.   4. No myocardial LGE, so no definitive evidence for prior MI, infiltrative disease, or myocarditis.  Cardiac MRI 10/29/2021: FINDINGS: Limited images of the lung fields showed no gross abnormalities.   Normal left ventricular size and wall thickness. Normal wall motion with EF 56%. Relative to the left  ventricle, the right ventricle was mildly dilated with moderately reduced systolic function, EF [id number]. Normal left atrial size with mildly dilated right atrium. No ASD was visualized. The right upper pulmonary vein drains into the SVC. Trileaflet aortic valve with no stenosis, trivial aortic insufficiency. No significant mitral regurgitation noted.   First pass perfusion imaging looks normal with no defect.   On delayed enhancement imaging, there was no myocardial late gadolinium enhancement (LGE).   MEASUREMENTS: MEASUREMENTS LVEDV 68 mL   LVSV 38 mL   LVEF 56%   RVEDV 141 mL RVSV 43 mL RVEF 30%   Aortic forward volume 28 mL   Aortic regurgitant fraction 6%   Pulmonary forward volume 37 mL   Pulmonic regurgitant fraction 4%   Qp/Qs 1.3   IMPRESSION: 1.  Normal LV size and systolic function, EF 0000000.   2. Relative to the LV, the RV was mildly dilated with moderate systolic dysfunction, EF calculated to 30%.   3. The right upper pulmonary vein drains to the SVC (views not ideal, did not have MRA done). I do not see a definite atrial septal defect. If there is high concern for ASD, TEE would be the best way to visualize. Qp/Qs is mildlly elevated at 1.3.   4. No myocardial LGE, so no definitive evidence for prior MI, infiltrative disease, or myocarditis.   Atrial Fibrillation Ablation 09/24/2021: CONCLUSIONS: 1. Sinus rhythm upon presentation.   2. Intracardiac echo reveals a moderate sized left atrium with four separate pulmonary veins without evidence of pulmonary vein stenosis.  The right superior pulmonary vein was atretic and noted on cardiac CT due to anomalous anatomy.  3. Successful electrical isolation and anatomical encircling of all four pulmonary veins with radiofrequency current.    4. Cavo-tricuspid isthmus ablation was performed with complete bidirectional isthmus block achieved.  5. No inducible arrhythmias following ablation both on and off of  Isuprel 6. No early apparent complications.  Cardiac CT 09/17/21:   IMPRESSION: 1. 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 The remainder of the pulmonary veins have normal drainage with measurements as above. There is an outpouching in the superior LA without clear connection to the right atrium, no clear ASD or contrast flow seen in these images.   2.  Mild enlargement of RA and RV.   3. There is no thrombus in the left atrial appendage.   4. The esophagus runs in the left atrial midline and is not in proximity to any of the pulmonary vein ostia.   5. Normal coronary origin.   6. CAC score of 5 which is 65th percentile for age-, race-, and sex-matched controls.  Cardiac Monitor  04/05/21: Patch wear time was 3 days and 1 hour Patient was in continuous Afib throughout the monitoring period (100% burden) with HR ranging from 62-164 with averag HR 106 Rare ventricular ectopy No significant pauses  TTE 04/18/21: IMPRESSIONS    1. Left ventricular ejection fraction, by estimation, is 55 to 60%. The  left ventricle has normal function. The left ventricle has no regional  wall motion abnormalities. Left ventricular diastolic function could not  be evaluated.   2. Right ventricular systolic function is normal. The right ventricular  size is normal. There is normal pulmonary artery systolic pressure.   3. The mitral valve is normal in structure. Trivial mitral valve  regurgitation. No evidence of mitral stenosis.   4. The aortic valve is tricuspid. Aortic valve regurgitation is not  visualized. No aortic stenosis is present.   5. The inferior vena cava is normal in size with greater than 50%  respiratory variability, suggesting right atrial pressure of 3 mmHg.   Comparison(s): No significant change from prior study. 11/03/16 EF 55%. PA  pressure 64mmHg.   TTE 11/03/16: Study Conclusions  - Left ventricle: The cavity size was normal. Wall thickness was    increased in a  pattern of mild LVH. The estimated ejection    fraction was 55%. Wall motion was normal; there were no regional    wall motion abnormalities. Left ventricular diastolic function    parameters were normal.  - Aortic valve: There was no stenosis.  - Mitral valve: There was trivial regurgitation.  - Right ventricle: The cavity size was normal. Systolic function    was normal.  - Right atrium: The atrium was mildly dilated.  - Tricuspid valve: Peak RV-RA gradient (S): 31 mm Hg.  - Pulmonary arteries: PA peak pressure: 34 mm Hg (S).  - Inferior vena cava: The vessel was normal in size. The    respirophasic diameter changes were in the normal range (>= 50%),    consistent with normal central venous pressure.   ETT 12/05/16:  Blood pressure demonstrated a normal response to exercise. There was no ST segment deviation noted during stress.   Good exercise capacity. Normal BP response to exertion. No ischemia.   EKG:  EKG is personally reviewed.   02/07/2022: Sinus rhythm . Rate 61 bpm.  04/23/2021: atrial fibrillation with HR 91  Recent Labs: 09/03/2021: Hemoglobin 16.4; Platelets 243 10/15/2021: NT-Pro BNP <36 11/12/2021: BUN 21; Creatinine, Ser 1.16; Magnesium 2.1; Potassium 4.3; Sodium 141  Recent Lipid Panel No results found for: "CHOL", "TRIG", "HDL", "CHOLHDL", "VLDL", "LDLCALC", "LDLDIRECT"   Risk Assessment/Calculations:    CHA2DS2-VASc Score =    {This indicates a  % annual risk of stroke. The patient's score is based upon:         Physical Exam:    VS:  There were no vitals taken for this visit.    Wt Readings from Last 3 Encounters:  02/07/22 208 lb 12.8 oz (94.7 kg)  11/06/21 212 lb (96.2 kg)  10/29/21 204 lb (92.5 kg)     GEN:  Comfortable, NAD HEENT: Normal NECK: No JVD; No carotid bruits CARDIAC: RRR, no murmurs, rubs, gallops RESPIRATORY:  Clear to auscultation without rales, scattered expiratory wheezing ABDOMEN: Soft, non-tender,  non-distended MUSCULOSKELETAL:  Warm, no edema SKIN: Warm and dry NEUROLOGIC:  Alert and oriented x 3 PSYCHIATRIC:  Normal affect   ASSESSMENT:    No diagnosis found.   PLAN:    In order of problems listed above:  #Chronic  Diastolic HF: RHC with elevated PCWP. Currently appears compensated on exam on increased dose of lasix. Currently with NYHA class II symptoms. Will continue current management.  -Continue lasix 40mg  daily Tu, Th, Sat, Sun and BID M, W, F -Continue spironolactone 12.5mg  daily -Continue potassium 42mEq daily -Continue metoprolol 75mg  XL daily -Low Na diet -Monitor daily weights and take additional lasix if gains >2-3lbs in 2-3 days  #Anomalous RUPV return: Noted on CTA obtained prior to Afib ablation. Follow-up CMR with mildly dilated RV with moderate systolic dysfunction and Qp:Qs 1.3. RHC with ADHD 12/2021 with Qp:Qs 1.7. No ASD visualized. Other veins with normal venous return. Given degree of shunting on RHC as well as evidence of RV enlargement, now planned for surgical correction. -Plan for surgery at [place] per Dr. Vivi Barrack Afib: #Palpitations: CHADs-vasc 1. Now s/p Afib ablation on 08/2021. Doing well without palpitations. Likely can stop AC; will discuss with Dr. Raul Del as well as he may order monitor prior to surgery to determine if patient needs MAZE. -Continue apixaban 5mg  BID for now; possible plan for cardiac monitor with Dr. Raul Del and can see if can come off AC in the future -Continue metop 75mg  XL daily -Continue flecainide 50mg  BID -Followed by Afib clinic  #HTN: Well controlled and at goal <120s/80s. -Continue amlodipine 10mg  daily -Continue metop 75mg  XL daily  #HLD: #Coronary Calcification: LDL 113 on 02/26/21. Ca score 5 (65% age, race and gender). -Continue lipitor 20mg  daily -Continue lifestyle modifications  Follow-up: 3 months  Medication Adjustments/Labs and Tests Ordered: Current medicines are reviewed at  length with the patient today.  Concerns regarding medicines are outlined above.  No orders of the defined types were placed in this encounter.  No orders of the defined types were placed in this encounter.    There are no Patient Instructions on file for this visit.  I,Makaylah Stokes,acting as a Education administrator for Freada Bergeron, MD.,have documented all relevant documentation on the behalf of Freada Bergeron, MD,as directed by  Freada Bergeron, MD while in the presence of Freada Bergeron, MD.    I, Freada Bergeron, MD, have reviewed all documentation for this visit. The documentation on 04/20/22 for the exam, diagnosis, procedures, and orders are all accurate and complete.   Signed, Freada Bergeron, MD  04/20/2022 4:08 PM    [facility]

## 2022-05-01 ENCOUNTER — Encounter: Payer: Self-pay | Admitting: Cardiology

## 2022-05-02 ENCOUNTER — Ambulatory Visit: Payer: No Typology Code available for payment source | Admitting: Cardiology

## 2022-05-04 NOTE — Progress Notes (Unsigned)
Cardiology Office Note:    Date:  05/04/2022   ID:  Christopher [name], DOB 1966-08-01, MRN [id number]  PCP:  Christopher Small, MD   [facility] Providers Cardiologist:  Christopher Sprague, MD {   Referring MD: Christopher Small, MD    History of Present Illness:    Christopher [name] is a 56 y.o. male with a hx of HTN, anomalous pulmonary venous return and paroxysmal atrial fibrillation who presents to clinic for follow-up.   Patient was previously seen in 2017 by Dr. Eldridge [name] for chest pain. He underwent ETT which was normal with no ischemia. TTE also obtained which revealed LVEF 55%, mild LVH, no significant valve disease.  Was seen in clinic 03/19/21 for symptomatic Afib. 3 day zio demonstrated persistent Afib throughout with average HR 106bpm. TTE 04/18/21 with LVEF 55-60%, normal RV, no significant valve disease. We increased his metop to 75mg  daily and changed xarelto to apixaban.  He followed up with Dr. and underwent successful afib ablation on 09/24/21. Following his ablation, he developed abdominal bloating and lower extremity edema for which he was started on lasix. Last saw Dr. 09/26/21 on 10/15/21 where he was continued on lasix. Then had follow-up with 10/17/21 where he contiued to look overloaded. His lasix was increased to 40mg  PO BID.  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 Follow-up CMR revealed mild RV dilation with moderate systolic dysfunction with Qp:Qs 1.3. He now returns to clinic for follow-up.  Saw Dr. Rudi [name] and underwent RHC 01/06/22 which showed Qp:Qs 1.7 as well as moderately elevated PCWP. Cath with mild, nonobstructive disease.   He underwent repair of isolated partial anomalous pulmonary venous return on 04/09/22 with Dr. 01/08/22. Following dicharge, the patient continued to have LE edema prompting visit her today.  Today, ***   Past Medical  History:  Diagnosis Date   A-fib (HCC)    on Eliquis, flecainide and metoprolol   Diverticulitis    Hypercholesteremia    Hypertension    Low back pain    recurrent   OSA (obstructive sleep apnea)    Mild    Past Surgical History:  Procedure Laterality Date   ATRIAL FIBRILLATION ABLATION N/A 09/24/2021   Procedure: ATRIAL FIBRILLATION ABLATION;  Surgeon: Christopher Blight, MD;  Location: [facility];  Service: Cardiovascular;  Laterality: N/A;   BICEPT TENODESIS Right 08/22/2021   Procedure: OPEN BICEPS TENODESIS;  Surgeon: Christopher Range, MD;  Location: [facility];  Service: Orthopedics;  Laterality: Right;   CARDIOVERSION N/A 05/17/2021   Procedure: CARDIOVERSION;  Surgeon: Christopher Pippin, MD;  Location: [facility];  Service: Cardiovascular;  Laterality: N/A;   LAPAROSCOPIC SIGMOID COLECTOMY     SHOULDER ARTHROSCOPY WITH SUBACROMIAL DECOMPRESSION Right 08/22/2021   Procedure: SHOULDER ARTHROSCOPY WITH SUBACROMIAL DECOMPRESSION;  Surgeon: [name], MD;  Location: [facility];  Service: Orthopedics;  Laterality: Right;    Current Medications: No outpatient medications have been marked as taking for the 05/06/22 encounter (Appointment) with Christopher Pippin, MD.     Allergies:   Patient has no known allergies.   Social History   Socioeconomic History   Marital status: Married    Spouse name: Not on file   Number of children: Not on file   Years of education: Not on file   Highest education level: Not on file  Occupational History   Not  on file  Tobacco Use   Smoking status: Every Day    Packs/day: 1.00    Types: Cigarettes   Smokeless tobacco: Never   Tobacco comments:    Smokes 3/4-1ppd  Substance and Sexual Activity   Alcohol use: Yes    Alcohol/week: 1.0 - 2.0 standard drink of alcohol    Types: 1 - 2 Glasses of wine per week    Comment: SOCIAL   Drug use: No   Sexual activity: Yes  Other Topics Concern   Not on file   Social History Narrative   Not on file   Social Determinants of Health   Financial Resource Strain: Not on file  Food Insecurity: Not on file  Transportation Needs: Not on file  Physical Activity: Not on file  Stress: Not on file  Social Connections: Not on file     Family History: The patient's family history is negative for Heart attack.  ROS:   Please see the history of present illness.    Review of Systems  Constitutional:  Negative for chills and fever.  HENT:  Negative for sore throat.   Eyes:  Negative for blurred vision.  Respiratory:  Positive for cough, shortness of breath and wheezing.   Cardiovascular:  Positive for leg swelling. Negative for chest pain, palpitations, orthopnea, claudication and PND.  Gastrointestinal:  Negative for blood in stool, melena, nausea and vomiting.  Genitourinary:  Negative for hematuria.  Musculoskeletal:  Negative for falls.  Neurological:  Negative for dizziness and loss of consciousness.  Endo/Heme/Allergies:  Positive for environmental allergies.  Psychiatric/Behavioral:  Negative for substance abuse.     EKGs/Labs/Other Studies Reviewed:    The following studies were reviewed today: TTE 05-23-22: Conclusions                                                              - Partial anomalous pulmonary venous return of right upper, right middle, and one      accessory pulmonary veins to the SVC/RA junction                                      S/P repair of isolated partial anomalous pulmonary venous return (04/09/22,            [place])                                                                            Right upper pulmonary vein appears patent by color and spectral Doppler                Trivial tricuspid regurgitation, peak RV-RA gradient at least 20 mmHg                  Mild right ventricular dilation and hypertrophy with normal right ventricular          systolic function  Normal left ventricular systolic function                                              No evidence of diastolic dysfunction                                                  No pericardial effusion                                                                 Findings  PROCEDURE INFORMATION  The image quality was fair.  post operative bandages, poor acoustic window, and patient position.  The subcostal were limited.    SURGERIES AND INTERVENTIONS  The patient has undergone the following procedure: repair of partial anomalous pulmonary venous connection using a sutureless technique involving the right upper and right middle pulmonary veins.    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  Abdominal situs is not well visualized.    VEINS  There is a partial anomalous pulmonary venous connection.  One right and one left sided pulmonary vein are seen connecting to the left atrium.  The flow patterns in the right and left pulmonary veins are normal.    ATRIA  The atrial septum is not well visualized on the study, cannot rule out smaller defects or a patent foramen ovale.The right atrium is mildly dilated.Normal left atrial size.    INLETS/ATRIOVENTRICULAR VALVES  The tricuspid valve is normal.  Inadequate tricuspid valve regurgitation to estimate right ventricular systolic pressures.   Tricuspid valve regurgitation peak gradient is at least 20 mmHg.  No tricuspid valve obstruction, there is trace regurgitation.    No mitral valve obstruction, with trace regurgitation.  The mitral valve is normal.  VENTRICLES  Mildly dilated right ventricle.  Mild right ventricular hypertrophy.  Normal right ventricular systolic function.  The left ventricular morphology, size and systolic function are normal.  The ventricular septum appears intact.    OUTLETS/SEMILUNAR VALVES  The right ventricular outflow  tract is unobstructed.The pulmonary valve is normal.  The left ventricular outflow tract is unobstructed.No pulmonic valve obstruction.  Trace pulmonary valve regurgitation.  The aortic valve is tricuspid.  No aortic valve  obstruction.  No aortic valve regurgitation.  The aortic valve is normal.    CORONARY ARTERIES  Coronary arteries not imaged.    GREAT ARTERIES  The aortic root diameter at the sinuses of Valsalva is normal.  Normal main pulmonary and branch pulmonary arteries.  There is unobstructed aortic arch and branching not assessed.  There is no evidence of coarctation of the aorta.  There is no patent  ductus arteriosus.    EXTRACARDIAC  There is no pericardial effusion.  The left pleural space is not assessed.  There is no right sided pleural effusion.      [facility]: Impressions:   Upper limits of normal right heart pressures with preserved cardiac output and moderately elevated pulmonary capillary  wedge pressure. 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 This vessel was  cannulated and a selective angiogram performed. The calculated Qp:Qs was 1.7. Coronary angiography showed mild, hemodynamically insignificant coronary disease.   Recommendations:   Will review patient data with Dr. Meredeth Ide and at combined [facility] Conference on Friday to help decide the need for an operative intervention.  Cardiac MRA 10/29/21: FINDINGS: Limited images of the lung fields showed no gross abnormalities.   Normal left ventricular size and wall thickness. Normal wall motion with EF 56%. Relative to the left ventricle, the right ventricle was mildly dilated with moderat/ely reduced systolic function, EF 30%. Normal left atrial size with mildly dilated right atrium. No ASD was visualized. The right upper pulmonary vein drains into the SVC. Trileaflet aortic valve with no stenosis, trivial aortic insufficiency. No  significant mitral regurgitation noted.   First pass perfusion imaging looks normal with no defect.   On delayed enhancement imaging, there was no myocardial late gadolinium enhancement (LGE).   MEASUREMENTS: MEASUREMENTS LVEDV 68 mL   LVSV 38 mL   LVEF 56%   RVEDV 141 mL RVSV 43 mL RVEF 30%   Aortic forward volume 28 mL   Aortic regurgitant fraction 6%   Pulmonary forward volume 37 mL   Pulmonic regurgitant fraction 4%   Qp/Qs 1.3   IMPRESSION: 1.  Normal LV size and systolic function, EF 56%.   2. Relative to the LV, the RV was mildly dilated with moderate systolic dysfunction, EF calculated to 30%.   3. The right upper pulmonary vein drains to the SVC (views not ideal, did not have MRA done). I do not see a definite atrial septal defect. If there is high concern for ASD, TEE would be the best way to visualize. Qp/Qs is mildlly elevated at 1.3.   4. No myocardial LGE, so no definitive evidence for prior MI, infiltrative disease, or myocarditis.  Cardiac MRI 10/29/2021: FINDINGS: Limited images of the lung fields showed no gross abnormalities.   Normal left ventricular size and wall thickness. Normal wall motion with EF 56%. Relative to the left ventricle, the right ventricle was mildly dilated with moderately reduced systolic function, EF 30%. Normal left atrial size with mildly dilated right atrium. No ASD was visualized. The right upper pulmonary vein drains into the SVC. Trileaflet aortic valve with no stenosis, trivial aortic insufficiency. No significant mitral regurgitation noted.   First pass perfusion imaging looks normal with no defect.   On delayed enhancement imaging, there was no myocardial late gadolinium enhancement (LGE).   MEASUREMENTS: MEASUREMENTS LVEDV 68 mL   LVSV 38 mL   LVEF 56%   RVEDV 141 mL RVSV 43 mL RVEF 30%   Aortic forward volume 28 mL   Aortic regurgitant fraction 6%   Pulmonary forward volume 37 mL   Pulmonic  regurgitant fraction 4%   Qp/Qs 1.3   IMPRESSION: 1.  Normal LV size and systolic function, EF 56%.   2. Relative to the LV, the RV was mildly dilated with moderate systolic dysfunction, EF calculated to 30%.   3. The right upper pulmonary vein drains to the SVC (views not ideal, did not have MRA done). I do not see a definite atrial septal defect. If there is high concern for ASD, TEE would be the best way to visualize. Qp/Qs is mildlly elevated at 1.3.   4. No myocardial LGE, so no definitive evidence for prior MI, infiltrative disease, or  myocarditis.   Atrial Fibrillation Ablation 09/24/2021: CONCLUSIONS: 1. Sinus rhythm upon presentation.   2. Intracardiac echo reveals a moderate sized left atrium with four separate pulmonary veins without evidence of pulmonary vein stenosis.  The right superior pulmonary vein was atretic and noted on cardiac CT due to anomalous anatomy.  3. Successful electrical isolation and anatomical encircling of all four pulmonary veins with radiofrequency current.    4. Cavo-tricuspid isthmus ablation was performed with complete bidirectional isthmus block achieved.  5. No inducible arrhythmias following ablation both on and off of Isuprel 6. No early apparent complications.  Cardiac CT 09/17/21:   IMPRESSION: 1. 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 The remainder of the pulmonary veins have normal drainage with measurements as above. There is an outpouching in the superior LA without clear connection to the right atrium, no clear ASD or contrast flow seen in these images.   2.  Mild enlargement of RA and RV.   3. There is no thrombus in the left atrial appendage.   4. The esophagus runs in the left atrial midline and is not in proximity to any of the pulmonary vein ostia.   5. Normal coronary origin.   6. CAC score of 5 which is 65th percentile for age-, race-, and sex-matched controls.  Cardiac Monitor 04/05/21: Patch wear time  was 3 days and 1 hour Patient was in continuous Afib throughout the monitoring period (100% burden) with HR ranging from 62-164 with averag HR 106 Rare ventricular ectopy No significant pauses  TTE 04/18/21: IMPRESSIONS    1. Left ventricular ejection fraction, by estimation, is 55 to 60%. The  left ventricle has normal function. The left ventricle has no regional  wall motion abnormalities. Left ventricular diastolic function could not  be evaluated.   2. Right ventricular systolic function is normal. The right ventricular  size is normal. There is normal pulmonary artery systolic pressure.   3. The mitral valve is normal in structure. Trivial mitral valve  regurgitation. No evidence of mitral stenosis.   4. The aortic valve is tricuspid. Aortic valve regurgitation is not  visualized. No aortic stenosis is present.   5. The inferior vena cava is normal in size with greater than 50%  respiratory variability, suggesting right atrial pressure of 3 mmHg.   Comparison(s): No significant change from prior study. 11/03/16 EF 55%. PA  pressure .   TTE 11/03/16: Study Conclusions  - Left ventricle: The cavity size was normal. Wall thickness was    increased in a pattern of mild LVH. The estimated ejection    fraction was 55%. Wall motion was normal; there were no regional    wall motion abnormalities. Left ventricular diastolic function    parameters were normal.  - Aortic valve: There was no stenosis.  - Mitral valve: There was trivial regurgitation.  - Right ventricle: The cavity size was normal. Systolic function    was normal.  - Right atrium: The atrium was mildly dilated.  - Tricuspid valve: Peak RV-RA gradient (S): 31 mm Hg.  - Pulmonary arteries: PA peak pressure: 34 mm Hg (S).  - Inferior vena cava: The vessel was normal in size. The    respirophasic diameter changes were in the normal [name] (>= 50%),    consistent with normal central venous pressure.   ETT  12/05/16:  Blood pressure demonstrated a normal response to exercise. There was no ST segment deviation noted during stress.   Good exercise capacity. Normal BP response  to exertion. No ischemia.   EKG:  EKG is personally reviewed.   02/07/2022: Sinus rhythm . Rate 61 bpm.  04/23/2021: atrial fibrillation with HR 91  Recent Labs: 09/03/2021: Hemoglobin 16.4; Platelets 243 10/15/2021: NT-Pro BNP <36 11/12/2021: BUN 21; Creatinine, Ser 1.16; Magnesium 2.1; Potassium 4.3; Sodium 141  Recent Lipid Panel No results found for: "CHOL", "TRIG", "HDL", "CHOLHDL", "VLDL", "LDLCALC", "LDLDIRECT"   Risk Assessment/Calculations:    CHA2DS2-VASc Score =    {This indicates a  % annual risk of stroke. The patient's score is based upon:         Physical Exam:    VS:  There were no vitals taken for this visit.    Wt Readings from Last 3 Encounters:  02/07/22 208 lb 12.8 oz (94.7 kg)  11/06/21 212 lb (96.2 kg)  10/29/21 204 lb (92.5 kg)     GEN:  Comfortable, NAD HEENT: Normal NECK: No JVD; No carotid bruits CARDIAC: RRR, no murmurs, rubs, gallops RESPIRATORY:  Clear to auscultation without rales, scattered expiratory wheezing ABDOMEN: Soft, non-tender, non-distended MUSCULOSKELETAL:  Warm, no edema SKIN: Warm and dry NEUROLOGIC:  Alert and oriented x 3 PSYCHIATRIC:  Normal affect   ASSESSMENT:    No diagnosis found.   PLAN:    In order of problems listed above:  #Acute on Chronic Diastolic HF: TTE 81/4481 with normal LVEF, patent repair of RUPV, trivail TR, mild RV dilation and hypertrophy with normal systolic function. Currently *** -Change lasix to torsemide 40mg  BID -Continue spironolactone 12.5mg  daily -Continue potassium daily -Continue metoprolol 75mg  XL daily -Low Na diet -Monitor daily weights and take additional lasix if gains >2-3lbs in 2-3 days  #Anomalous RUPV return: Noted on CTA obtained prior to Afib ablation. Follow-up CMR with mildly dilated RV  with moderate systolic dysfunction and Qp:Qs 1.3. RHC with ADHD 12/2021 with Qp:Qs 1.7. No ASD visualized. Other veins with normal venous return. Given degree of shunting on RHC as well as evidence of RV enlargement, he underwent surgical correction at [facility] on 04/09/22. -Plan for surgery at [facility] per Dr. 04/11/22 Afib: #Palpitations: CHADs-vasc 1. Now s/p Afib ablation on 08/2021. Doing well without palpitations. Likely can stop AC; will discuss with Dr. Dot Been as well as he may order monitor prior to surgery to determine if patient needs MAZE. -Continue apixaban 5mg  BID for now; possible plan for cardiac monitor with Dr. 09/2021 and can see if can come off AC in the future -Continue metop 75mg  XL daily -Continue flecainide 50mg  BID -Followed by Afib clinic  #HTN: Well controlled and at goal <120s/80s. -Continue amlodipine 10mg  daily -Continue metop 75mg  XL daily  #HLD: #Coronary Calcification: LDL 113 on 02/26/21. Ca score 5 (65% age, race and gender). -Continue lipitor 20mg  daily -Continue lifestyle modifications  Follow-up: 3 months  Medication Adjustments/Labs and Tests Ordered: Current medicines are reviewed at length with the patient today.  Concerns regarding medicines are outlined above.  No orders of the defined types were placed in this encounter.  No orders of the defined types were placed in this encounter.    There are no Patient Instructions on file for this visit.  I,Makaylah Stokes,acting as a for Meredeth Ide, MD.,have documented all relevant documentation on the behalf of , MD,as directed by  , MD while in the presence of , MD.    I, , MD, have reviewed all documentation for this visit. The documentation on 05/04/22  for the exam, diagnosis, procedures, and orders are all accurate and complete.   Signed, Christopher Sprague, MD  05/04/2022 8:36 PM    [facility]

## 2022-05-06 ENCOUNTER — Encounter: Payer: Self-pay | Admitting: Cardiology

## 2022-05-06 ENCOUNTER — Ambulatory Visit (INDEPENDENT_AMBULATORY_CARE_PROVIDER_SITE_OTHER): Payer: No Typology Code available for payment source | Admitting: Cardiology

## 2022-05-06 VITALS — BP 90/70 | HR 84 | Ht 66.0 in | Wt 200.2 lb

## 2022-05-06 DIAGNOSIS — Z79899 Other long term (current) drug therapy: Secondary | ICD-10-CM

## 2022-05-06 DIAGNOSIS — D6869 Other thrombophilia: Secondary | ICD-10-CM | POA: Diagnosis not present

## 2022-05-06 DIAGNOSIS — I10 Essential (primary) hypertension: Secondary | ICD-10-CM

## 2022-05-06 DIAGNOSIS — Q264 Anomalous pulmonary venous connection, unspecified: Secondary | ICD-10-CM | POA: Diagnosis not present

## 2022-05-06 DIAGNOSIS — I480 Paroxysmal atrial fibrillation: Secondary | ICD-10-CM

## 2022-05-06 DIAGNOSIS — I5033 Acute on chronic diastolic (congestive) heart failure: Secondary | ICD-10-CM

## 2022-05-06 DIAGNOSIS — I2510 Atherosclerotic heart disease of native coronary artery without angina pectoris: Secondary | ICD-10-CM | POA: Diagnosis not present

## 2022-05-06 MED ORDER — METOPROLOL TARTRATE 25 MG PO TABS: 12.5000 mg | ORAL_TABLET | Freq: Two times a day (BID) | ORAL | 3 refills | Status: DC

## 2022-05-06 MED ORDER — FLECAINIDE ACETATE 50 MG PO TABS: 75.0000 mg | ORAL_TABLET | Freq: Two times a day (BID) | ORAL | 2 refills | Status: DC

## 2022-05-06 MED ORDER — TORSEMIDE 40 MG PO TABS: ORAL_TABLET | ORAL | 0 refills | Status: DC

## 2022-05-06 MED ORDER — POTASSIUM CHLORIDE CRYS ER 20 MEQ PO TBCR: EXTENDED_RELEASE_TABLET | ORAL | 0 refills | Status: DC

## 2022-05-06 NOTE — Patient Instructions (Signed)
Medication Instructions:   STOP TAKING METOPROLOL SUCCINATE (TOPROL XL) NOW  STOP TAKING LASIX NOW  CONTINUE TAKING FLECAINIDE 75 MG BY MOUTH TWICE DAILY  DECREASE YOUR SPIRONOLACTONE TO 25 MG BY MOUTH DAILY  DECREASE YOUR METOPROLOL TARTRATE (LOPRESSOR) 12.5 MG BY MOUTH TWICE DAILY  START TAKING TORSEMIDE 40 MG BY MOUTH TWICE DAILY FOR 3 DAYS ONLY, THEN DECREASE TO TAKING 40 MG BY MOUTH DAILY THEREAFTER  INCREASE YOUR POTASSIUM CHLORIDE TO 40 mEq BY MOUTH TWICE DAILY FOR 3 DAYS ONLY, THEN DECREASE TO 40 mEq BY MOUTH DAILY THEREAFTER.  *If you need a refill on your cardiac medications before your next appointment, please call your pharmacy*   Lab Work:  1.) TOMORROW 05/07/22--PRO-BNP  2.)  IN 10 DAYS-- BMET  If you have labs (blood work) drawn today and your tests are completely normal, you will receive your results only by: MyChart Message (if you have MyChart) OR A paper copy in the mail If you have any lab test that is abnormal or we need to change your treatment, we will call you to review the results.    Follow-Up:  AS ALREADY SCHEDULED     Important Information About Sugar

## 2022-05-06 NOTE — Progress Notes (Signed)
Cardiology Office Note:    Date:  05/06/2022   ID:  Christopher [name], DOB 1966-05-11, MRN [id number]  PCP:  Kelton Pillar, MD   [facility] Providers Cardiologist:  Freada Bergeron, MD {   Referring MD: Kelton Pillar, MD    History of Present Illness:    Christopher [name] is a 56 y.o. male with a hx of HTN, anomalous pulmonary venous return and paroxysmal atrial fibrillation who presents to clinic for follow-up.   Patient was previously seen in 2017 by Dr. Irish Lack for chest pain. He underwent ETT which was normal with no ischemia. TTE also obtained which revealed LVEF 55%, mild LVH, no significant valve disease.  Was seen in clinic 03/19/21 for symptomatic Afib. 3 day zio demonstrated persistent Afib throughout with average HR 106bpm. TTE 04/18/21 with LVEF 55-60%, normal RV, no significant valve disease. We increased his metop to 75mg  daily and changed xarelto to apixaban.  He followed up with Dr. Rayann Heman and underwent successful afib ablation on 09/24/21. Following his ablation, he developed abdominal bloating and lower extremity edema for which he was started on lasix. Last saw Dr. Curt Bears on 10/15/21 where he was continued on lasix. Then had follow-up with Roderic Palau where he contiued to look overloaded. His lasix was increased to 40mg  PO BID.  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 Follow-up CMR revealed mild RV dilation with moderate systolic dysfunction with Qp:Qs 1.3. He now returns to clinic for follow-up.  Saw Dr. Vella Kohler and underwent [place] 01/06/22 which showed Qp:Qs 1.7 as well as moderately elevated PCWP. Cath with mild, nonobstructive disease.   He underwent repair of isolated partial anomalous pulmonary venous return on 04/09/22 with Dr. Granville Lewis. Following dicharge, the patient continued to have LE edema prompting visit her today.  Today, he is accompanied by  his wife. He states he is overall doing well and is recovering from surgery. He continues to have significant LE edema that has been present since his hospitalization. He is now on lasix 60mg  BID and spironolactone 50mg  daily but his symptoms have persisted. His blood pressure is also running on the softer side in 90/60s at home. No chest pain, lightheadedness or dizziness. Has been having to sleep in a reclined position due to SOB with laying flat. His wife also notes he is dyspneic with exertion.   Of note, he had LAA clipping during surgery. His apixaban has since been stopped. He is also off amlodipine and his metop was changed to tartrate.     Past Medical History:  Diagnosis Date   A-fib ([place])    on Eliquis, flecainide and metoprolol   Diverticulitis    Hypercholesteremia    Hypertension    Low back pain    recurrent   OSA (obstructive sleep apnea)    Mild    Past Surgical History:  Procedure Laterality Date   ATRIAL FIBRILLATION ABLATION N/A 09/24/2021   Procedure: ATRIAL FIBRILLATION ABLATION;  Surgeon: Thompson Grayer, MD;  Location: [facility];  Service: Cardiovascular;  Laterality: N/A;   BICEPT TENODESIS Right 08/22/2021   Procedure: OPEN BICEPS TENODESIS;  Surgeon: Hiram Gash, MD;  Location: [facility];  Service: Orthopedics;  Laterality: Right;   CARDIOVERSION N/A 05/17/2021   Procedure: CARDIOVERSION;  Surgeon: Pixie Casino, MD;  Location: [facility];  Service: Cardiovascular;  Laterality: N/A;   [place]  SHOULDER ARTHROSCOPY WITH SUBACROMIAL DECOMPRESSION Right 08/22/2021   Procedure: SHOULDER ARTHROSCOPY WITH SUBACROMIAL DECOMPRESSION;  Surgeon: Hiram Gash, MD;  Location: [facility];  Service: Orthopedics;  Laterality: Right;    Current Medications: Current Meds  Medication Sig   aspirin EC 81 MG tablet Take 81 mg by mouth daily. Swallow whole.   atorvastatin (LIPITOR) 20 MG tablet Take 20 mg  by mouth at bedtime.   benzonatate (TESSALON) 100 MG capsule Take 100 mg by mouth as needed.   Coenzyme Q10 (COQ-10 PO) Take 1 capsule by mouth daily. Qunol   flecainide (TAMBOCOR) 50 MG tablet Take 1.5 tablets (75 mg total) by mouth 2 (two) times daily.   metoprolol tartrate (LOPRESSOR) 25 MG tablet Take 0.5 tablets (12.5 mg total) by mouth 2 (two) times daily.   potassium chloride SA (KLOR-CON M) 20 MEQ tablet Take 2 tablets (40 mEq total) by mouth twice daily for 3 days only, then decrease to taking 2 tablets (40 mEq total) by mouth daily thereafter.   Torsemide 40 MG TABS Take 1 tablet (40 mg total) by mouth twice daily for 3 days only, then decrease to taking 1 tablet (40 mg total) by mouth daily thereafter.   [DISCONTINUED] amLODipine (NORVASC) 5 MG tablet Take 5 mg by mouth at bedtime.   [DISCONTINUED] ELIQUIS 5 MG TABS tablet TAKE 1 TABLET(5 MG) BY MOUTH TWICE DAILY   [DISCONTINUED] flecainide (TAMBOCOR) 50 MG tablet TAKE 1 TABLET(50 MG) BY MOUTH TWICE DAILY (Patient taking differently: Take 150 mg by mouth. Per patient taking (75) mg1/2 tablet twice a day)   [DISCONTINUED] furosemide (LASIX) 40 MG tablet Take 40 mg twice daily on Mon, Wed, and Fridays alernating 40 mg once daily on Tue, Thurs, and Saturdays. (Patient taking differently: Take 40 mg by mouth 2 (two) times daily. Per patient taking 60 mg twice a day)   [DISCONTINUED] metoprolol tartrate (LOPRESSOR) 25 MG tablet Take 25 mg by mouth 2 (two) times daily.   [DISCONTINUED] potassium chloride SA (KLOR-CON M) 20 MEQ tablet Take 2 tablets (40 mEq total) by mouth daily. (Patient taking differently: Take 20 mEq by mouth 2 (two) times daily.)   [DISCONTINUED] spironolactone (ALDACTONE) 25 MG tablet Take 25 mg by mouth daily. Per patient taking 50 mg daily     Allergies:   Patient has no known allergies.   Social History   Socioeconomic History   Marital status: Married    Spouse name: Not on file   Number of children: Not on file    Years of education: Not on file   Highest education level: Not on file  Occupational History   Not on file  Tobacco Use   Smoking status: Every Day    Packs/day: 1.00    Types: Cigarettes   Smokeless tobacco: Never   Tobacco comments:    Smokes 3/4-1ppd  Substance and Sexual Activity   Alcohol use: Yes    Alcohol/week: 1.0 - 2.0 standard drink of alcohol    Types: 1 - 2 Glasses of wine per week    Comment: SOCIAL   Drug use: No   Sexual activity: Yes  Other Topics Concern   Not on file  Social History Narrative   Not on file   Social Determinants of Health   Financial Resource Strain: Not on file  Food Insecurity: Not on file  Transportation Needs: Not on file  Physical Activity: Not on file  Stress: Not on file  Social Connections: Not on file  Family History: The patient's family history is negative for Heart attack.  ROS:   Please see the history of present illness.    Review of Systems  Constitutional:  Negative for chills and fever.  HENT:  Negative for sore throat.   Eyes:  Negative for blurred vision.  Respiratory:  Positive for cough, shortness of breath and wheezing.   Cardiovascular:  Positive for leg swelling. Negative for chest pain, palpitations, orthopnea, claudication and PND.  Gastrointestinal:  Negative for blood in stool, melena, nausea and vomiting.  Genitourinary:  Negative for hematuria.  Musculoskeletal:  Negative for falls.  Neurological:  Negative for dizziness and loss of consciousness.  Endo/Heme/Allergies:  Positive for environmental allergies.  Psychiatric/Behavioral:  Negative for substance abuse.     EKGs/Labs/Other Studies Reviewed:    The following studies were reviewed today: TTE 2022/05/27: Conclusions                                                              - Partial anomalous pulmonary venous return of right upper, right middle, and one      accessory pulmonary veins to the SVC/RA junction                                       S/P repair of isolated partial anomalous pulmonary venous return (04/09/22,            [place])                                                                            Right upper pulmonary vein appears patent by color and spectral Doppler                Trivial tricuspid regurgitation, peak RV-RA gradient at least 20 mmHg                  Mild right ventricular dilation and hypertrophy with normal right ventricular          systolic function                                                                      Normal left ventricular systolic function                                              No evidence of diastolic dysfunction  No pericardial effusion                                                                 Findings  PROCEDURE INFORMATION  The image quality was fair.  post operative bandages, poor acoustic window, and patient position.  The subcostal were limited.    SURGERIES AND INTERVENTIONS  The patient has undergone the following procedure: repair of partial anomalous pulmonary venous connection using a sutureless technique involving the right upper and right middle pulmonary veins.    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  Abdominal situs is not well visualized.    VEINS  There is a partial anomalous pulmonary venous connection.  One right and one left sided pulmonary vein are seen connecting to the left atrium.  The flow patterns in the right and left pulmonary veins are normal.    ATRIA  The atrial septum is not well visualized on the study, cannot rule out smaller defects or a patent foramen ovale.The right atrium is mildly dilated.Normal left atrial size.    INLETS/ATRIOVENTRICULAR VALVES  The tricuspid valve is normal.  Inadequate tricuspid valve regurgitation to estimate right ventricular systolic pressures.   Tricuspid  valve regurgitation peak gradient is at least 20 mmHg.  No tricuspid valve obstruction, there is trace regurgitation.    No mitral valve obstruction, with trace regurgitation.  The mitral valve is normal.  VENTRICLES  Mildly dilated right ventricle.  Mild right ventricular hypertrophy.  Normal right ventricular systolic function.  The left ventricular morphology, size and systolic function are normal.  The ventricular septum appears intact.    OUTLETS/SEMILUNAR VALVES  The right ventricular outflow tract is unobstructed.The pulmonary valve is normal.  The left ventricular outflow tract is unobstructed.No pulmonic valve obstruction.  Trace pulmonary valve regurgitation.  The aortic valve is tricuspid.  No aortic valve  obstruction.  No aortic valve regurgitation.  The aortic valve is normal.    CORONARY ARTERIES  Coronary arteries not imaged.    GREAT ARTERIES  The aortic root diameter at the sinuses of Valsalva is normal.  Normal main pulmonary and branch pulmonary arteries.  There is unobstructed aortic arch and branching not assessed.  There is no evidence of coarctation of the aorta.  There is no patent  ductus arteriosus.    EXTRACARDIAC  There is no pericardial effusion.  The left pleural space is not assessed.  There is no right sided pleural effusion.      [facility] at [facility] 12/2021: Impressions:   Upper limits of normal right heart pressures with preserved cardiac output and moderately elevated pulmonary capillary wedge pressure. 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 This vessel was  cannulated and a selective angiogram performed. The calculated Qp:Qs was 1.7. Coronary angiography showed mild, hemodynamically insignificant coronary disease.   Recommendations:   Will review patient data with Dr. Raul Del and at combined [facility] Conference on Friday to help decide the need for an operative intervention.  Cardiac MRA  10/29/21: FINDINGS: Limited images of the lung fields showed no gross abnormalities.   Normal left ventricular size and wall thickness. Normal wall motion with EF 56%. Relative to the left  ventricle, the right ventricle was mildly dilated with moderat/ely reduced systolic function, EF [id number]. Normal left atrial size with mildly dilated right atrium. No ASD was visualized. The right upper pulmonary vein drains into the SVC. Trileaflet aortic valve with no stenosis, trivial aortic insufficiency. No significant mitral regurgitation noted.   First pass perfusion imaging looks normal with no defect.   On delayed enhancement imaging, there was no myocardial late gadolinium enhancement (LGE).   MEASUREMENTS: MEASUREMENTS LVEDV 68 mL   LVSV 38 mL   LVEF 56%   RVEDV 141 mL RVSV 43 mL RVEF 30%   Aortic forward volume 28 mL   Aortic regurgitant fraction 6%   Pulmonary forward volume 37 mL   Pulmonic regurgitant fraction 4%   Qp/Qs 1.3   IMPRESSION: 1.  Normal LV size and systolic function, EF 0000000.   2. Relative to the LV, the RV was mildly dilated with moderate systolic dysfunction, EF calculated to 30%.   3. The right upper pulmonary vein drains to the SVC (views not ideal, did not have MRA done). I do not see a definite atrial septal defect. If there is high concern for ASD, TEE would be the best way to visualize. Qp/Qs is mildlly elevated at 1.3.   4. No myocardial LGE, so no definitive evidence for prior MI, infiltrative disease, or myocarditis.  Cardiac MRI 10/29/2021: FINDINGS: Limited images of the lung fields showed no gross abnormalities.   Normal left ventricular size and wall thickness. Normal wall motion with EF 56%. Relative to the left ventricle, the right ventricle was mildly dilated with moderately reduced systolic function, EF [id number]. Normal left atrial size with mildly dilated right atrium. No ASD was visualized. The right upper pulmonary vein drains into  the SVC. Trileaflet aortic valve with no stenosis, trivial aortic insufficiency. No significant mitral regurgitation noted.   First pass perfusion imaging looks normal with no defect.   On delayed enhancement imaging, there was no myocardial late gadolinium enhancement (LGE).   MEASUREMENTS: MEASUREMENTS LVEDV 68 mL   LVSV 38 mL   LVEF 56%   RVEDV 141 mL RVSV 43 mL RVEF 30%   Aortic forward volume 28 mL   Aortic regurgitant fraction 6%   Pulmonary forward volume 37 mL   Pulmonic regurgitant fraction 4%   Qp/Qs 1.3   IMPRESSION: 1.  Normal LV size and systolic function, EF 0000000.   2. Relative to the LV, the RV was mildly dilated with moderate systolic dysfunction, EF calculated to 30%.   3. The right upper pulmonary vein drains to the SVC (views not ideal, did not have MRA done). I do not see a definite atrial septal defect. If there is high concern for ASD, TEE would be the best way to visualize. Qp/Qs is mildlly elevated at 1.3.   4. No myocardial LGE, so no definitive evidence for prior MI, infiltrative disease, or myocarditis.   Atrial Fibrillation Ablation 09/24/2021: CONCLUSIONS: 1. Sinus rhythm upon presentation.   2. Intracardiac echo reveals a moderate sized left atrium with four separate pulmonary veins without evidence of pulmonary vein stenosis.  The right superior pulmonary vein was atretic and noted on cardiac CT due to anomalous anatomy.  3. Successful electrical isolation and anatomical encircling of all four pulmonary veins with radiofrequency current.    4. Cavo-tricuspid isthmus ablation was performed with complete bidirectional isthmus block achieved.  5. No inducible arrhythmias following ablation both on and off of Isuprel 6. No early apparent complications.  Cardiac CT  09/17/21:   IMPRESSION: 1. 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 The remainder of the pulmonary veins have normal drainage with measurements as above. There is an  outpouching in the superior LA without clear connection to the right atrium, no clear ASD or contrast flow seen in these images.   2.  Mild enlargement of RA and RV.   3. There is no thrombus in the left atrial appendage.   4. The esophagus runs in the left atrial midline and is not in proximity to any of the pulmonary vein ostia.   5. Normal coronary origin.   6. CAC score of 5 which is 65th percentile for age-, race-, and sex-matched controls.  Cardiac Monitor 04/05/21: Patch wear time was 3 days and 1 hour Patient was in continuous Afib throughout the monitoring period (100% burden) with HR ranging from 62-164 with averag HR 106 Rare ventricular ectopy No significant pauses  TTE 04/18/21: IMPRESSIONS    1. Left ventricular ejection fraction, by estimation, is 55 to 60%. The  left ventricle has normal function. The left ventricle has no regional  wall motion abnormalities. Left ventricular diastolic function could not  be evaluated.   2. Right ventricular systolic function is normal. The right ventricular  size is normal. There is normal pulmonary artery systolic pressure.   3. The mitral valve is normal in structure. Trivial mitral valve  regurgitation. No evidence of mitral stenosis.   4. The aortic valve is tricuspid. Aortic valve regurgitation is not  visualized. No aortic stenosis is present.   5. The inferior vena cava is normal in size with greater than 50%  respiratory variability, suggesting right atrial pressure of 3 mmHg.   Comparison(s): No significant change from prior study. 11/03/16 EF 55%. PA  pressure 19mmHg.   TTE 11/03/16: Study Conclusions  - Left ventricle: The cavity size was normal. Wall thickness was    increased in a pattern of mild LVH. The estimated ejection    fraction was 55%. Wall motion was normal; there were no regional    wall motion abnormalities. Left ventricular diastolic function    parameters were normal.  - Aortic valve: There was  no stenosis.  - Mitral valve: There was trivial regurgitation.  - Right ventricle: The cavity size was normal. Systolic function    was normal.  - Right atrium: The atrium was mildly dilated.  - Tricuspid valve: Peak RV-RA gradient (S): 31 mm Hg.  - Pulmonary arteries: PA peak pressure: 34 mm Hg (S).  - Inferior vena cava: The vessel was normal in size. The    respirophasic diameter changes were in the normal range (>= 50%),    consistent with normal central venous pressure.   ETT 12/05/16:  Blood pressure demonstrated a normal response to exercise. There was no ST segment deviation noted during stress.   Good exercise capacity. Normal BP response to exertion. No ischemia.   EKG:  EKG is personally reviewed.  ECG with NSR with LVH, HR 84 02/07/2022: Sinus rhythm . Rate 61 bpm.  04/23/2021: atrial fibrillation with HR 91  Recent Labs: 09/03/2021: Hemoglobin 16.4; Platelets 243 10/15/2021: NT-Pro BNP <36 11/12/2021: BUN 21; Creatinine, Ser 1.16; Magnesium 2.1; Potassium 4.3; Sodium 141  Recent Lipid Panel No results found for: "CHOL", "TRIG", "HDL", "CHOLHDL", "VLDL", "LDLCALC", "LDLDIRECT"   Risk Assessment/Calculations:    CHA2DS2-VASc Score =    {This indicates a  % annual risk of stroke. The patient's score is based upon:  Physical Exam:    VS:  BP 90/70   Pulse 84   Ht 5\' 6"  (1.676 m)   Wt 200 lb 3.2 oz (90.8 kg)   SpO2 95%   BMI 32.31 kg/m     Wt Readings from Last 3 Encounters:  05/06/22 200 lb 3.2 oz (90.8 kg)  02/07/22 208 lb 12.8 oz (94.7 kg)  11/06/21 212 lb (96.2 kg)     GEN:  Comfortable, NAD HEENT: Normal NECK: No JVD; No carotid bruits CARDIAC: RRR, no murmurs, rubs, gallops RESPIRATORY:  Clear to auscultation without rales, scattered expiratory wheezing ABDOMEN: Soft, non-tender, non-distended MUSCULOSKELETAL:  2+ pitting edema to mid-shin SKIN: Warm and dry NEUROLOGIC:  Alert and oriented x 3 PSYCHIATRIC:  Normal affect    ASSESSMENT:    1. Acute on chronic diastolic heart failure ([place])   2. Anomalous pulmonary venous return   3. Coronary artery disease involving native coronary artery of native heart without angina pectoris   4. Secondary hypercoagulable state ([place])   5. Paroxysmal atrial fibrillation ([place])   6. Acquired thrombophilia ([place])   7. Primary hypertension   8. Medication management      PLAN:    In order of problems listed above:  #Acute on Chronic Diastolic HF: TTE [id number] with normal LVEF, patent repair of RUPV, trivail TR, mild RV dilation and hypertrophy with normal systolic function. Currently, overloaded on exam with 2+ pitting edema to the  -Change lasix to torsemide 40mg  BID for 3 days and then 40mg  daily thereafter -Increase potassium to 109mEq BID x3 days and then daily thereafter with extra dose as needed for weight gain -Decrease spironolactone to 25mg  daily due to soft blood pressures -Decrease metop tartrate to 12.5mg  BID due to soft pressures -Low Na diet -Monitor daily weights  #Anomalous RUPV return: Noted on CTA obtained prior to Afib ablation. Follow-up CMR with mildly dilated RV with moderate systolic dysfunction and Qp:Qs 1.3. RHC with ADHD 12/2021 with Qp:Qs 1.7. No ASD visualized. Other veins with normal venous return. Given degree of shunting on RHC as well as evidence of RV enlargement, he underwent surgical correction at [facility] on 04/09/22. -S/p surgery at [facility]; doing well post-op  #Paroxysmal Afib: #Palpitations: CHADs-vasc 1. Now s/p Afib ablation on 08/2021 and LAA clipping with surgery. Doing well without palpitations. Now off apixaban. -Off apixaban -Decrease metop to 12.5mg  BID as above -Continue flecainide 75mg  BID -Followed by Afib clinic  #HTN: Now with soft blood pressures in 90/60s. -Off amlodipine -Decrease spiro to 25mg  daily -Decrease metop to 12.5mg  BID  #HLD: #Coronary Calcification: LDL 113 on 02/26/21. Ca score 5 (65% age, race and  gender). -Continue lipitor 20mg  daily -Continue lifestyle modifications   Medication Adjustments/Labs and Tests Ordered: Current medicines are reviewed at length with the patient today.  Concerns regarding medicines are outlined above.  Orders Placed This Encounter  Procedures   Pro b natriuretic peptide   Basic metabolic panel   EKG [id number]   Meds ordered this encounter  Medications   flecainide (TAMBOCOR) 50 MG tablet    Sig: Take 1.5 tablets (75 mg total) by mouth 2 (two) times daily.    Dispense:  270 tablet    Refill:  2   metoprolol tartrate (LOPRESSOR) 25 MG tablet    Sig: Take 0.5 tablets (12.5 mg total) by mouth 2 (two) times daily.    Dispense:  90 tablet    Refill:  3    DOSE DECREASE   Torsemide 40 MG TABS  Sig: Take 1 tablet (40 mg total) by mouth twice daily for 3 days only, then decrease to taking 1 tablet (40 mg total) by mouth daily thereafter.    Dispense:  36 tablet    Refill:  0   potassium chloride SA (KLOR-CON M) 20 MEQ tablet    Sig: Take 2 tablets (40 mEq total) by mouth twice daily for 3 days only, then decrease to taking 2 tablets (40 mEq total) by mouth daily thereafter.    Dispense:  70 tablet    Refill:  0    Dose increase and pt to take in concurrence with lasix.     Patient Instructions  Medication Instructions:   STOP TAKING METOPROLOL SUCCINATE (TOPROL XL) NOW  STOP TAKING LASIX NOW  CONTINUE TAKING FLECAINIDE 75 MG BY MOUTH TWICE DAILY  DECREASE YOUR SPIRONOLACTONE TO 25 MG BY MOUTH DAILY  DECREASE YOUR METOPROLOL TARTRATE (LOPRESSOR) 12.5 MG BY MOUTH TWICE DAILY  START TAKING TORSEMIDE 40 MG BY MOUTH TWICE DAILY FOR 3 DAYS ONLY, THEN DECREASE TO TAKING 40 MG BY MOUTH DAILY THEREAFTER  INCREASE YOUR POTASSIUM CHLORIDE TO 40 mEq BY MOUTH TWICE DAILY FOR 3 DAYS ONLY, THEN DECREASE TO 40 mEq BY MOUTH DAILY THEREAFTER.  *If you need a refill on your cardiac medications before your next appointment, please call your  pharmacy*   Lab Work:  1.) TOMORROW 05/07/22--PRO-BNP  2.)  IN 10 DAYS-- BMET  If you have labs (blood work) drawn today and your tests are completely normal, you will receive your results only by: MyChart Message (if you have MyChart) OR A paper copy in the mail If you have any lab test that is abnormal or we need to change your treatment, we will call you to review the results.    Follow-Up:  AS ALREADY SCHEDULED     Important Information About Sugar          I,Tinashe Williams,acting as a scribe for [name], MD.,have documented all relevant documentation on the behalf of [name], MD,as directed by  [name], MD while in the presence of [name], MD.   I, [name], MD, have reviewed all documentation for this visit. The documentation on 05/06/22 for the exam, diagnosis, procedures, and orders are all accurate and complete.

## 2022-05-07 ENCOUNTER — Telehealth: Payer: Self-pay

## 2022-05-07 ENCOUNTER — Other Ambulatory Visit (HOSPITAL_COMMUNITY): Payer: Self-pay | Admitting: *Deleted

## 2022-05-07 ENCOUNTER — Other Ambulatory Visit: Payer: No Typology Code available for payment source

## 2022-05-07 DIAGNOSIS — I5033 Acute on chronic diastolic (congestive) heart failure: Secondary | ICD-10-CM

## 2022-05-07 DIAGNOSIS — D6869 Other thrombophilia: Secondary | ICD-10-CM

## 2022-05-07 DIAGNOSIS — I10 Essential (primary) hypertension: Secondary | ICD-10-CM

## 2022-05-07 DIAGNOSIS — Z79899 Other long term (current) drug therapy: Secondary | ICD-10-CM

## 2022-05-07 DIAGNOSIS — Z8774 Personal history of (corrected) congenital malformations of heart and circulatory system: Secondary | ICD-10-CM

## 2022-05-07 DIAGNOSIS — I2510 Atherosclerotic heart disease of native coronary artery without angina pectoris: Secondary | ICD-10-CM

## 2022-05-07 DIAGNOSIS — I480 Paroxysmal atrial fibrillation: Secondary | ICD-10-CM

## 2022-05-07 DIAGNOSIS — Q264 Anomalous pulmonary venous connection, unspecified: Secondary | ICD-10-CM

## 2022-05-07 NOTE — Telephone Encounter (Signed)
Okay to switch to Torsemide 20 mg tablets and have patient take 2 tablets to equal 40 mg.

## 2022-05-07 NOTE — Telephone Encounter (Signed)
[facility] pharmacy is requesting a change in pt's medication torsemide 40 mg tablets. Pharmacy stated that the medication does not come in 40 mg tablets, only 20 mg tablets. Would Dr. Shari Prows like to prescribe this medication for torsemide 20 mg tablets, pt taking 2 tablets by mouth BID for 3 days, then decrease to 2 tablets daily? Please address

## 2022-05-08 ENCOUNTER — Other Ambulatory Visit: Payer: Self-pay | Admitting: *Deleted

## 2022-05-08 DIAGNOSIS — I10 Essential (primary) hypertension: Secondary | ICD-10-CM

## 2022-05-08 DIAGNOSIS — I480 Paroxysmal atrial fibrillation: Secondary | ICD-10-CM

## 2022-05-08 DIAGNOSIS — I2510 Atherosclerotic heart disease of native coronary artery without angina pectoris: Secondary | ICD-10-CM

## 2022-05-08 DIAGNOSIS — Z79899 Other long term (current) drug therapy: Secondary | ICD-10-CM

## 2022-05-08 DIAGNOSIS — D6869 Other thrombophilia: Secondary | ICD-10-CM

## 2022-05-08 DIAGNOSIS — Q264 Anomalous pulmonary venous connection, unspecified: Secondary | ICD-10-CM

## 2022-05-08 DIAGNOSIS — I5033 Acute on chronic diastolic (congestive) heart failure: Secondary | ICD-10-CM

## 2022-05-08 LAB — PRO B NATRIURETIC PEPTIDE: NT-Pro BNP: 134 pg/mL (ref 0–210)

## 2022-05-08 MED ORDER — TORSEMIDE 40 MG PO TABS: ORAL_TABLET | ORAL | 0 refills | Status: DC

## 2022-05-09 ENCOUNTER — Encounter: Payer: Self-pay | Admitting: Cardiology

## 2022-05-09 MED ORDER — TORSEMIDE 20 MG PO TABS: ORAL_TABLET | ORAL | 0 refills | Status: DC

## 2022-05-09 NOTE — Telephone Encounter (Signed)
Spoke to the Pharmacist at [facility] and he endorsed if we called in the 20 mg tablets of torsemide, that will be covered by the pts insurance.  Called in the 20 mg tablets of torsemide to the pharmacy with instructions for the pt to take 40 mg po bid x 3 days, then decrease to taking 40 mg po daily thereafter.  Pt and wife aware of this and aware that he will have to take 2 tablets of the 20 mg torsemide bid x 3 days then 2 tablets po daily thereafter to equal dosing of 40 mg bid x 3 days, then decrease to 40 mg po daily thereafter.   Wife and pt verbalized understanding and will pick this up here shortly.  They were appreciative for all the assistance provided. They will update Dr. Shari Prows on how his fluid levels are with medication on board.

## 2022-05-12 ENCOUNTER — Telehealth: Payer: Self-pay | Admitting: *Deleted

## 2022-05-12 ENCOUNTER — Encounter: Payer: Self-pay | Admitting: Cardiology

## 2022-05-12 DIAGNOSIS — I4819 Other persistent atrial fibrillation: Secondary | ICD-10-CM

## 2022-05-12 DIAGNOSIS — Z0189 Encounter for other specified special examinations: Secondary | ICD-10-CM

## 2022-05-12 DIAGNOSIS — I2510 Atherosclerotic heart disease of native coronary artery without angina pectoris: Secondary | ICD-10-CM

## 2022-05-12 DIAGNOSIS — I5033 Acute on chronic diastolic (congestive) heart failure: Secondary | ICD-10-CM

## 2022-05-12 DIAGNOSIS — Z79899 Other long term (current) drug therapy: Secondary | ICD-10-CM

## 2022-05-12 DIAGNOSIS — I5032 Chronic diastolic (congestive) heart failure: Secondary | ICD-10-CM

## 2022-05-12 NOTE — Telephone Encounter (Signed)
Lab appt rescheduled for the pt for next Wednesday 7/26, to check a BMET and Mg Level per Dr. Shari Prows.  Pt and wife made aware of this via mychart message.

## 2022-05-12 NOTE — Telephone Encounter (Signed)
FMLA forms printed off of [employer] on this pt.  Forms printed off and given to our front registrar representative, to start the process and collected payment.  Forms will then be given to Dr. Shari Prows to further review and complete/sign off on forms, thereafter. Will follow-up with the pt and wife when forms our completed.

## 2022-05-12 NOTE — Telephone Encounter (Signed)
Received FMLA forms, LVM for pt to come in and pay the fee and fill out additional forms   Thank you

## 2022-05-13 ENCOUNTER — Other Ambulatory Visit (HOSPITAL_BASED_OUTPATIENT_CLINIC_OR_DEPARTMENT_OTHER): Payer: Self-pay | Admitting: Cardiology

## 2022-05-13 ENCOUNTER — Telehealth: Payer: Self-pay | Admitting: Cardiology

## 2022-05-13 MED ORDER — METOLAZONE 2.5 MG PO TABS: 2.5000 mg | ORAL_TABLET | ORAL | 0 refills | Status: DC

## 2022-05-13 NOTE — Telephone Encounter (Signed)
FMLA forms placed in Dr. Devin Going folder to review and sign. Will call the pt/wife once forms complete.

## 2022-05-13 NOTE — Telephone Encounter (Signed)
[name], LPN at 01/05/8117  5:15 PM  Status: Signed  FMLA forms placed in Dr. Devin Going folder to review and sign. Will call the pt/wife once forms complete.      Christopher [name], Christopher [name] at 05/13/2022  4:38 PM  Status: Signed  Forms and Fee Received, FMLA will be in providers box.    Thank you

## 2022-05-13 NOTE — Telephone Encounter (Signed)
Forms and Fee Received, FMLA will be in providers box.   Thank you

## 2022-05-14 NOTE — Telephone Encounter (Signed)
   Status: Signed  Pt and wife aware that FMLA forms were completed by Dr. Shari Prows and the front office registrar faxed this off to the contact information provided on forms. Both verbalized understanding and agrees with this plan.  Both were gracious for all the assistance provided.

## 2022-05-14 NOTE — Telephone Encounter (Signed)
Pt and wife aware that FMLA forms were completed by Dr. Shari Prows and the front office registrar faxed this off to the contact information provided on forms. Both verbalized understanding and agrees with this plan.  Both were gracious for all the assistance provided.

## 2022-05-14 NOTE — Telephone Encounter (Signed)
Christopher [name], Christopher [name] at 05/14/2022 11:03 AM  Status: Signed  Forms completed and received by from desk Faxed papers to appropriate destinations    Thank youuu

## 2022-05-14 NOTE — Telephone Encounter (Signed)
Forms completed and received by from desk Faxed papers to appropriate destinations   Thank youuu

## 2022-05-14 NOTE — Telephone Encounter (Signed)
Dr. Pemberton completed the pts FMLA forms.  Completed forms given to front desk registrar for completion, and to follow-up with the pt and wife about this.  

## 2022-05-14 NOTE — Telephone Encounter (Signed)
Dr. Shari Prows completed the pts FMLA forms.  Completed forms given to front desk registrar for completion, and to follow-up with the pt and wife about this.

## 2022-05-16 ENCOUNTER — Other Ambulatory Visit: Payer: No Typology Code available for payment source

## 2022-05-21 ENCOUNTER — Other Ambulatory Visit: Payer: No Typology Code available for payment source

## 2022-05-21 DIAGNOSIS — Z79899 Other long term (current) drug therapy: Secondary | ICD-10-CM

## 2022-05-21 DIAGNOSIS — I5032 Chronic diastolic (congestive) heart failure: Secondary | ICD-10-CM

## 2022-05-21 DIAGNOSIS — Z0189 Encounter for other specified special examinations: Secondary | ICD-10-CM

## 2022-05-21 DIAGNOSIS — I2510 Atherosclerotic heart disease of native coronary artery without angina pectoris: Secondary | ICD-10-CM

## 2022-05-21 DIAGNOSIS — I4819 Other persistent atrial fibrillation: Secondary | ICD-10-CM

## 2022-05-21 DIAGNOSIS — I5033 Acute on chronic diastolic (congestive) heart failure: Secondary | ICD-10-CM

## 2022-05-21 LAB — BASIC METABOLIC PANEL
BUN/Creatinine Ratio: 23 — ABNORMAL HIGH (ref 9–20)
BUN: 26 mg/dL — ABNORMAL HIGH (ref 6–24)
CO2: 23 mmol/L (ref 20–29)
Calcium: 9.8 mg/dL (ref 8.7–10.2)
Chloride: 98 mmol/L (ref 96–106)
Creatinine, Ser: 1.15 mg/dL (ref 0.76–1.27)
Glucose: 193 mg/dL — ABNORMAL HIGH (ref 70–99)
Potassium: 3.8 mmol/L (ref 3.5–5.2)
Sodium: 139 mmol/L (ref 134–144)
eGFR: 75 mL/min/{1.73_m2} (ref >59)

## 2022-05-21 LAB — MAGNESIUM: Magnesium: 2 mg/dL (ref 1.6–2.3)

## 2022-05-25 NOTE — Progress Notes (Signed)
Cardiology Office Note:    Date:  05/30/2022   ID:  Christopher [name], DOB 10-02-1966, MRN [id number]  PCP:  [name], MD   [facility] Providers Cardiologist:  [name], MD {   Referring MD: [name], MD    History of Present Illness:    Christopher [name] is a 56 y.o. male with a hx of HTN, anomalous pulmonary venous return and paroxysmal atrial fibrillation who presents to clinic for follow-up.   Patient was previously seen in 2017 by [name] for chest pain. He underwent ETT which was normal with no ischemia. TTE also obtained which revealed LVEF 55%, mild LVH, no significant valve disease.  Was seen in clinic 03/19/21 for symptomatic Afib. 3 day zio demonstrated persistent Afib throughout with average HR 106bpm. TTE 04/18/21 with LVEF 55-60%, normal RV, no significant valve disease. We increased his metop to 75mg  daily and changed xarelto to apixaban.  He followed up with Dr. and underwent successful afib ablation on 09/24/21. Following his ablation, he developed abdominal bloating and lower extremity edema for which he was started on lasix. Last saw Dr. 09/26/21 on 10/15/21 where he was continued on lasix. Then had follow-up with 10/17/21 where he contiued to look overloaded. His lasix was increased to 40mg  PO BID.  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 Follow-up CMR revealed mild RV dilation with moderate systolic dysfunction with Qp:Qs 1.3. He now returns to clinic for follow-up.  Saw Dr. [name] and underwent RHC 01/06/22 which showed Qp:Qs 1.7 as well as moderately elevated PCWP. Cath with mild, nonobstructive disease.   He underwent repair of isolated partial anomalous pulmonary venous return on 04/09/22 with Dr. 01/08/22. Following dicharge, the patient continued to have LE edema prompting visit her today.  Was last seen in clinic  05/06/22 where he was having significant LE edema. He was otherwise recovering well from his surgery. We changed his lasix to torsemide and started intermittent metolazone.  Today, the patient overall feels better. Swelling is improved. He is currently taking torsemide 40mg  in the morning and metolazone 2.5mg  weekly. He is also taking potassium [name] BID. Blood pressure is running mainly 90s-low 100s. No chest pain, orthopnea, palpitations. Continues to have dyspnea on exertion with walking which is unchanged. Tolerating medications. Hoping to return to work.  Of note, he had LAA clipping during surgery. His apixaban has since been stopped. He is also off amlodipine and his metop was changed to tartrate.     Past Medical History:  Diagnosis Date   A-fib (HCC)    on Eliquis, flecainide and metoprolol   Diverticulitis    Hypercholesteremia    Hypertension    Low back pain    recurrent   OSA (obstructive sleep apnea)    Mild    Past Surgical History:  Procedure Laterality Date   ATRIAL FIBRILLATION ABLATION N/A 09/24/2021   Procedure: ATRIAL FIBRILLATION ABLATION;  Surgeon: , MD;  Location: [facility];  Service: Cardiovascular;  Laterality: N/A;   BICEPT TENODESIS Right 08/22/2021   Procedure: OPEN BICEPS TENODESIS;  Surgeon: 09/26/2021, MD;  Location: [facility];  Service: Orthopedics;  Laterality: Right;   CARDIOVERSION N/A 05/17/2021   Procedure: CARDIOVERSION;  Surgeon: 08/24/2021, MD;  Location: [facility];  Service: Cardiovascular;  Laterality: N/A;   LAPAROSCOPIC SIGMOID COLECTOMY     SHOULDER  ARTHROSCOPY WITH SUBACROMIAL DECOMPRESSION Right 08/22/2021   Procedure: SHOULDER ARTHROSCOPY WITH SUBACROMIAL DECOMPRESSION;  Surgeon: [name], MD;  Location: [facility];  Service: Orthopedics;  Laterality: Right;    Current Medications: Current Meds  Medication Sig   aspirin EC 81 MG tablet Take 81 mg by mouth daily.  Swallow whole.   atorvastatin (LIPITOR) 20 MG tablet Take 20 mg by mouth at bedtime.   benzonatate (TESSALON) 100 MG capsule Take 100 mg by mouth as needed.   Coenzyme Q10 (COQ-10 PO) Take 1 capsule by mouth daily. Qunol   flecainide (TAMBOCOR) 50 MG tablet Take 1.5 tablets (75 mg total) by mouth 2 (two) times daily.   potassium chloride SA (KLOR-CON M) 20 MEQ tablet Take 40 mEq by mouth daily.   torsemide (DEMADEX) 20 MG tablet Take 40 mg by mouth daily.   [DISCONTINUED] metolazone (ZAROXOLYN) 2.5 MG tablet Take 1 tablet (2.5 mg total) by mouth once a week.   [DISCONTINUED] metoprolol tartrate (LOPRESSOR) 25 MG tablet Take 0.5 tablets (12.5 mg total) by mouth 2 (two) times daily.     Allergies:   Patient has no known allergies.   Social History   Socioeconomic History   Marital status: Married    Spouse name: Not on file   Number of children: Not on file   Years of education: Not on file   Highest education level: Not on file  Occupational History   Not on file  Tobacco Use   Smoking status: Every Day    Packs/day: 1.00    Types: Cigarettes   Smokeless tobacco: Never   Tobacco comments:    Smokes 3/4-1ppd  Substance and Sexual Activity   Alcohol use: Yes    Alcohol/week: 1.0 - 2.0 standard drink of alcohol    Types: 1 - 2 Glasses of wine per week    Comment: SOCIAL   Drug use: No   Sexual activity: Yes  Other Topics Concern   Not on file  Social History Narrative   Not on file   Social Determinants of Health   Financial Resource Strain: Not on file  Food Insecurity: Not on file  Transportation Needs: Not on file  Physical Activity: Not on file  Stress: Not on file  Social Connections: Not on file     Family History: The patient's family history is negative for Heart attack.  ROS:   Please see the history of present illness.    Review of Systems  Constitutional:  Negative for chills and fever.  HENT:  Negative for sore throat.   Eyes:  Negative for blurred  vision.  Respiratory:  Positive for cough, shortness of breath and wheezing.   Cardiovascular:  Positive for leg swelling. Negative for chest pain, palpitations, orthopnea, claudication and PND.  Gastrointestinal:  Negative for blood in stool, melena, nausea and vomiting.  Genitourinary:  Negative for hematuria.  Musculoskeletal:  Negative for falls.  Neurological:  Negative for dizziness and loss of consciousness.  Endo/Heme/Allergies:  Positive for environmental allergies.  Psychiatric/Behavioral:  Negative for substance abuse.     EKGs/Labs/Other Studies Reviewed:    The following studies were reviewed today: TTE 13-May-2022: Conclusions                                                              -  Partial anomalous pulmonary venous return of right upper, right middle, and one      accessory pulmonary veins to the SVC/RA junction                                      S/P repair of isolated partial anomalous pulmonary venous return (04/09/22,            [place])                                                                            Right upper pulmonary vein appears patent by color and spectral Doppler                Trivial tricuspid regurgitation, peak RV-RA gradient at least 20 mmHg                  Mild right ventricular dilation and hypertrophy with normal right ventricular          systolic function                                                                      Normal left ventricular systolic function                                              No evidence of diastolic dysfunction                                                  No pericardial effusion                                                                 Findings  PROCEDURE INFORMATION  The image quality was fair.  post operative bandages, poor acoustic window, and patient position.  The subcostal were limited.    SURGERIES AND INTERVENTIONS  The patient has undergone the following procedure: repair of  partial anomalous pulmonary venous connection using a sutureless technique involving the right upper and right middle pulmonary veins.    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  Abdominal situs is not well visualized.    VEINS  There is a partial anomalous pulmonary venous connection.  One right and one left sided pulmonary vein are seen connecting to the left atrium.  The flow patterns in the right and left pulmonary veins are  normal.    ATRIA  The atrial septum is not well visualized on the study, cannot rule out smaller defects or a patent foramen ovale.The right atrium is mildly dilated.Normal left atrial size.    INLETS/ATRIOVENTRICULAR VALVES  The tricuspid valve is normal.  Inadequate tricuspid valve regurgitation to estimate right ventricular systolic pressures.   Tricuspid valve regurgitation peak gradient is at least 20 mmHg.  No tricuspid valve obstruction, there is trace regurgitation.    No mitral valve obstruction, with trace regurgitation.  The mitral valve is normal.  VENTRICLES  Mildly dilated right ventricle.  Mild right ventricular hypertrophy.  Normal right ventricular systolic function.  The left ventricular morphology, size and systolic function are normal.  The ventricular septum appears intact.    OUTLETS/SEMILUNAR VALVES  The right ventricular outflow tract is unobstructed.The pulmonary valve is normal.  The left ventricular outflow tract is unobstructed.No pulmonic valve obstruction.  Trace pulmonary valve regurgitation.  The aortic valve is tricuspid.  No aortic valve  obstruction.  No aortic valve regurgitation.  The aortic valve is normal.    CORONARY ARTERIES  Coronary arteries not imaged.    GREAT ARTERIES  The aortic root diameter at the sinuses of Valsalva is normal.  Normal main pulmonary and branch pulmonary arteries.  There is unobstructed aortic arch and branching not  assessed.  There is no evidence of coarctation of the aorta.  There is no patent  ductus arteriosus.    EXTRACARDIAC  There is no pericardial effusion.  The left pleural space is not assessed.  There is no right sided pleural effusion.      RHC/LHC at [facility] 12/2021: Impressions:   Upper limits of normal right heart pressures with preserved cardiac output and moderately elevated pulmonary capillary wedge pressure. 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 This vessel was  cannulated and a selective angiogram performed. The calculated Qp:Qs was 1.7. Coronary angiography showed mild, hemodynamically insignificant coronary disease.   Recommendations:   Will review patient data with Dr. Meredeth Ide and at combined [facility] on Friday to help decide the need for an operative intervention.  Cardiac MRA 10/29/21: FINDINGS: Limited images of the lung fields showed no gross abnormalities.   Normal left ventricular size and wall thickness. Normal wall motion with EF 56%. Relative to the left ventricle, the right ventricle was mildly dilated with moderat/ely reduced systolic function, EF 30%. Normal left atrial size with mildly dilated right atrium. No ASD was visualized. The right upper pulmonary vein drains into the SVC. Trileaflet aortic valve with no stenosis, trivial aortic insufficiency. No significant mitral regurgitation noted.   First pass perfusion imaging looks normal with no defect.   On delayed enhancement imaging, there was no myocardial late gadolinium enhancement (LGE).   MEASUREMENTS: MEASUREMENTS LVEDV 68 mL   LVSV 38 mL   LVEF 56%   RVEDV 141 mL RVSV 43 mL RVEF 30%   Aortic forward volume 28 mL   Aortic regurgitant fraction 6%   Pulmonary forward volume 37 mL   Pulmonic regurgitant fraction 4%   Qp/Qs 1.3   IMPRESSION: 1.  Normal LV size and systolic function, EF 56%.   2. Relative to  the LV, the RV was mildly dilated with moderate systolic dysfunction, EF calculated to 30%.   3. The right upper pulmonary vein drains to the SVC (views not ideal, did not have MRA done). I do not see a definite atrial septal defect. If  there is high concern for ASD, TEE would be the best way to visualize. Qp/Qs is mildlly elevated at 1.3.   4. No myocardial LGE, so no definitive evidence for prior MI, infiltrative disease, or myocarditis.  Cardiac MRI 10/29/2021: FINDINGS: Limited images of the lung fields showed no gross abnormalities.   Normal left ventricular size and wall thickness. Normal wall motion with EF 56%. Relative to the left ventricle, the right ventricle was mildly dilated with moderately reduced systolic function, EF 30%. Normal left atrial size with mildly dilated right atrium. No ASD was visualized. The right upper pulmonary vein drains into the SVC. Trileaflet aortic valve with no stenosis, trivial aortic insufficiency. No significant mitral regurgitation noted.   First pass perfusion imaging looks normal with no defect.   On delayed enhancement imaging, there was no myocardial late gadolinium enhancement (LGE).   MEASUREMENTS: MEASUREMENTS LVEDV 68 mL   LVSV 38 mL   LVEF 56%   RVEDV 141 mL RVSV 43 mL RVEF 30%   Aortic forward volume 28 mL   Aortic regurgitant fraction 6%   Pulmonary forward volume 37 mL   Pulmonic regurgitant fraction 4%   Qp/Qs 1.3   IMPRESSION: 1.  Normal LV size and systolic function, EF 56%.   2. Relative to the LV, the RV was mildly dilated with moderate systolic dysfunction, EF calculated to 30%.   3. The right upper pulmonary vein drains to the SVC (views not ideal, did not have MRA done). I do not see a definite atrial septal defect. If there is high concern for ASD, TEE would be the best way to visualize. Qp/Qs is mildlly elevated at 1.3.   4. No myocardial LGE, so no definitive evidence for prior  MI, infiltrative disease, or myocarditis.   Atrial Fibrillation Ablation 09/24/2021: CONCLUSIONS: 1. Sinus rhythm upon presentation.   2. Intracardiac echo reveals a moderate sized left atrium with four separate pulmonary veins without evidence of pulmonary vein stenosis.  The right superior pulmonary vein was atretic and noted on cardiac CT due to anomalous anatomy.  3. Successful electrical isolation and anatomical encircling of all four pulmonary veins with radiofrequency current.    4. Cavo-tricuspid isthmus ablation was performed with complete bidirectional isthmus block achieved.  5. No inducible arrhythmias following ablation both on and off of Isuprel 6. No early apparent complications.  Cardiac CT 09/17/21:   IMPRESSION: 1. 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 The remainder of the pulmonary veins have normal drainage with measurements as above. There is an outpouching in the superior LA without clear connection to the right atrium, no clear ASD or contrast flow seen in these images.   2.  Mild enlargement of RA and RV.   3. There is no thrombus in the left atrial appendage.   4. The esophagus runs in the left atrial midline and is not in proximity to any of the pulmonary vein ostia.   5. Normal coronary origin.   6. CAC score of 5 which is 65th percentile for age-, race-, and sex-matched controls.  Cardiac Monitor 04/05/21: Patch wear time was 3 days and 1 hour Patient was in continuous Afib throughout the monitoring period (100% burden) with HR ranging from 62-164 with averag HR 106 Rare ventricular ectopy No significant pauses  TTE 04/18/21: IMPRESSIONS    1. Left ventricular ejection fraction, by estimation, is 55 to 60%. The  left ventricle has normal function. The left ventricle has no regional  wall motion abnormalities. Left  ventricular diastolic function could not  be evaluated.   2. Right ventricular systolic function is normal. The right  ventricular  size is normal. There is normal pulmonary artery systolic pressure.   3. The mitral valve is normal in structure. Trivial mitral valve  regurgitation. No evidence of mitral stenosis.   4. The aortic valve is tricuspid. Aortic valve regurgitation is not  visualized. No aortic stenosis is present.   5. The inferior vena cava is normal in size with greater than 50%  respiratory variability, suggesting right atrial pressure of 3 mmHg.   Comparison(s): No significant change from prior study. 11/03/16 EF 55%. PA  pressure .   TTE 11/03/16: Study Conclusions  - Left ventricle: The cavity size was normal. Wall thickness was    increased in a pattern of mild LVH. The estimated ejection    fraction was 55%. Wall motion was normal; there were no regional    wall motion abnormalities. Left ventricular diastolic function    parameters were normal.  - Aortic valve: There was no stenosis.  - Mitral valve: There was trivial regurgitation.  - Right ventricle: The cavity size was normal. Systolic function    was normal.  - Right atrium: The atrium was mildly dilated.  - Tricuspid valve: Peak RV-RA gradient (S): 31 mm Hg.  - Pulmonary arteries: PA peak pressure: 34 mm Hg (S).  - Inferior vena cava: The vessel was normal in size. The    respirophasic diameter changes were in the normal range (>= 50%),    consistent with normal central venous pressure.   ETT 12/05/16:  Blood pressure demonstrated a normal response to exercise. There was no ST segment deviation noted during stress.   Good exercise capacity. Normal BP response to exertion. No ischemia.   EKG:  EKG is personally reviewed.  ECG with NSR with LVH, HR 84 02/07/2022: Sinus rhythm . Rate 61 bpm.  04/23/2021: atrial fibrillation with HR 91  Recent Labs: 09/03/2021: Hemoglobin 16.4; Platelets 243 05/07/2022: NT-Pro BNP 134 05/21/2022: BUN 26; Creatinine, Ser 1.15; Magnesium 2.0; Potassium 3.8; Sodium 139  Recent Lipid  Panel No results found for: "CHOL", "TRIG", "HDL", "CHOLHDL", "VLDL", "LDLCALC", "LDLDIRECT"   Risk Assessment/Calculations:    CHA2DS2-VASc Score =    {This indicates a  % annual risk of stroke. The patient's score is based upon:         Physical Exam:    VS:  BP 110/80   Pulse 90   Ht 6' (1.829 m)   Wt 205 lb 12.8 oz (93.4 kg)   SpO2 97%   BMI 27.91 kg/m     Wt Readings from Last 3 Encounters:  05/30/22 205 lb 12.8 oz (93.4 kg)  05/06/22 200 lb 3.2 oz (90.8 kg)  02/07/22 208 lb 12.8 oz (94.7 kg)     GEN:  Comfortable, NAD HEENT: Normal NECK: No JVD; No carotid bruits CARDIAC: RRR, no murmurs, rubs, gallops RESPIRATORY:  Clear to auscultation without rales, scattered expiratory wheezing ABDOMEN: Soft, non-tender, non-distended MUSCULOSKELETAL:  2+ pitting edema to mid-shin SKIN: Warm and dry NEUROLOGIC:  Alert and oriented x 3 PSYCHIATRIC:  Normal affect   ASSESSMENT:    1. Acute on chronic diastolic heart failure (HCC)   2. Coronary artery disease involving native coronary artery of native heart without angina pectoris   3. Anomalous pulmonary venous return   4. Secondary hypercoagulable state (HCC)   5. Primary hypertension   6. Paroxysmal atrial fibrillation (HCC)  PLAN:    In order of problems listed above:  #Acute on Chronic Diastolic HF: TTE 16/1096 with normal LVEF, patent repair of RUPV, trivail TR, mild RV dilation and hypertrophy with normal systolic function. Had significant LE edema post-op which has now significantly improved.  -Continue torsemide  daily -Continue metolazone 2.5mg  weekly prn -Continue potassium to daily -Continue spiro  daily -Hold metop due to low blood pressures -Low Na diet -Monitor daily weights  #Anomalous RUPV return: Noted on CTA obtained prior to Afib ablation. Follow-up CMR with mildly dilated RV with moderate systolic dysfunction and Qp:Qs 1.3. RHC with ADHD 12/2021 with Qp:Qs 1.7. No ASD  visualized. Other veins with normal venous return. Given degree of shunting on RHC as well as evidence of RV enlargement, he underwent surgical correction at [facility] on 04/09/22. -S/p surgery at [facility]; doing well post-op  #Paroxysmal Afib: #Palpitations: CHADs-vasc 1. Now s/p Afib ablation on 08/2021 and LAA clipping with surgery. Doing well without palpitations. Now off apixaban. -Off apixaban -Stop metop due to low blood pressures -Continue flecainide  BID -Followed by Afib clinic  #HTN: Now with low blood pressures.  -Off amlodipine and spironolactone -Hold metop for now given low blood pressures  #HLD: #Coronary Calcification: LDL 113 on 02/26/21. Ca score 5 (65% age, race and gender). -Continue lipitor  daily -Continue lifestyle modifications   Medication Adjustments/Labs and Tests Ordered: Current medicines are reviewed at length with the patient today.  Concerns regarding medicines are outlined above.  No orders of the defined types were placed in this encounter.  No orders of the defined types were placed in this encounter.    Patient Instructions  Medication Instructions:  Your physician has recommended you make the following change in your medication:  1) STOP taking metoprolol  2) CHANGE metolazone as needed   *If you need a refill on your cardiac medications before your next appointment, please call your pharmacy*  Lab Work: BMET in 3 weeks  If you have labs (blood work) drawn today and your tests are completely normal, you will receive your results only by: MyChart Message (if you have MyChart) OR A paper copy in the mail If you have any lab test that is abnormal or we need to change your treatment, we will call you to review the results.   Follow-Up: At [facility], you and your health needs are our priority.  As part of our continuing mission to provide you with exceptional heart care, we have created designated Provider Care Teams.  These Care  Teams include your primary Cardiologist (physician) and Advanced Practice Providers (APPs -  Physician Assistants and Nurse Practitioners) who all work together to provide you with the care you need, when you need it.   Your next appointment:   3 month(s)  The format for your next appointment:   In Person  Provider:   Chelsea Aus, PA-C, Jari Favre, PA-C, Dayna Dunn, PA-C, Jacolyn Reedy, PA-C, Eligha Bridegroom, NP, or [name], PA-C        Important Information About Sugar

## 2022-05-30 ENCOUNTER — Encounter: Payer: Self-pay | Admitting: Cardiology

## 2022-05-30 ENCOUNTER — Ambulatory Visit (INDEPENDENT_AMBULATORY_CARE_PROVIDER_SITE_OTHER): Payer: No Typology Code available for payment source | Admitting: Cardiology

## 2022-05-30 VITALS — BP 110/80 | HR 90 | Ht 72.0 in | Wt 205.8 lb

## 2022-05-30 DIAGNOSIS — Q264 Anomalous pulmonary venous connection, unspecified: Secondary | ICD-10-CM

## 2022-05-30 DIAGNOSIS — I2510 Atherosclerotic heart disease of native coronary artery without angina pectoris: Secondary | ICD-10-CM

## 2022-05-30 DIAGNOSIS — I480 Paroxysmal atrial fibrillation: Secondary | ICD-10-CM

## 2022-05-30 DIAGNOSIS — I10 Essential (primary) hypertension: Secondary | ICD-10-CM

## 2022-05-30 DIAGNOSIS — D6869 Other thrombophilia: Secondary | ICD-10-CM

## 2022-05-30 DIAGNOSIS — I5033 Acute on chronic diastolic (congestive) heart failure: Secondary | ICD-10-CM | POA: Diagnosis not present

## 2022-05-30 MED ORDER — METOLAZONE 2.5 MG PO TABS: 2.5000 mg | ORAL_TABLET | ORAL | 3 refills | Status: DC | PRN

## 2022-05-30 NOTE — Patient Instructions (Signed)
Medication Instructions:  Your physician has recommended you make the following change in your medication:  1) STOP taking metoprolol  2) CHANGE metolazone as needed   *If you need a refill on your cardiac medications before your next appointment, please call your pharmacy*  Lab Work: BMET in 3 weeks  If you have labs (blood work) drawn today and your tests are completely normal, you will receive your results only by: MyChart Message (if you have MyChart) OR A paper copy in the mail If you have any lab test that is abnormal or we need to change your treatment, we will call you to review the results.   Follow-Up: At [facility], you and your health needs are our priority.  As part of our continuing mission to provide you with exceptional heart care, we have created designated Provider Care Teams.  These Care Teams include your primary Cardiologist (physician) and Advanced Practice Providers (APPs -  Physician Assistants and Nurse Practitioners) who all work together to provide you with the care you need, when you need it.   Your next appointment:   3 month(s)  The format for your next appointment:   In Person  Provider:   Chelsea Aus, PA-C, Jari Favre, PA-C, Dayna Dunn, PA-C, Jacolyn Reedy, PA-C, Eligha Bridegroom, NP, or [name], PA-C        Important Information About Sugar

## 2022-06-04 ENCOUNTER — Telehealth: Payer: Self-pay | Admitting: Cardiology

## 2022-06-04 ENCOUNTER — Other Ambulatory Visit: Payer: Self-pay

## 2022-06-04 MED ORDER — TORSEMIDE 20 MG PO TABS: 40.0000 mg | ORAL_TABLET | Freq: Every day | ORAL | 3 refills | Status: DC

## 2022-06-04 MED ORDER — POTASSIUM CHLORIDE CRYS ER 20 MEQ PO TBCR: 40.0000 meq | EXTENDED_RELEASE_TABLET | Freq: Every day | ORAL | 3 refills | Status: DC

## 2022-06-04 NOTE — Telephone Encounter (Signed)
Called patient wife to inquire whether patient had returned to work as he was given a letter last week by Dr. Shari Prows that he may return with restriction of no lifting greater than 20 lbs for one month. Patient's wife states that he has been back at work since Monday and has been doing well. He is tired when he comes home but overall doing good. They will let us know if anything changes.   Attempted to call Feliz Beam back. Unable to reach him.

## 2022-06-04 NOTE — Telephone Encounter (Signed)
New Message:     Onalee Hua called. He wants to know if there is an estimated return to work date for patient please?

## 2022-06-16 ENCOUNTER — Encounter: Payer: Self-pay | Admitting: Cardiology

## 2022-06-16 DIAGNOSIS — Z0189 Encounter for other specified special examinations: Secondary | ICD-10-CM

## 2022-06-16 DIAGNOSIS — I5032 Chronic diastolic (congestive) heart failure: Secondary | ICD-10-CM

## 2022-06-16 DIAGNOSIS — Z79899 Other long term (current) drug therapy: Secondary | ICD-10-CM

## 2022-06-16 DIAGNOSIS — I5033 Acute on chronic diastolic (congestive) heart failure: Secondary | ICD-10-CM

## 2022-06-16 MED ORDER — SPIRONOLACTONE 25 MG PO TABS: 25.0000 mg | ORAL_TABLET | Freq: Every day | ORAL | 0 refills | Status: DC

## 2022-06-17 NOTE — Telephone Encounter (Signed)
June 16, 2022 Werner Lean, MD to Me   [facility]   06/16/22  6:17 PM Could we get a follow up BMP for this patient?  I reviewed her prior notes, she decrease aldactone and increased torsemide  from July to August.  He has a couple of metolazones and his K was 3.7.  Now he should be just on aldactone, torsemide and K.  If labs look good then I think we can feel comfortable we don't have to change further   Thanks,  MAC     Mychart message sent to the pts wife about pt needing to get into the office for quick blood work to check a bmet, for sometime this week.  This is to ensure stability of kidney function and electrolytes.  Will await for the pt and wife to message back with a good lab appt day for this week.

## 2022-06-18 ENCOUNTER — Encounter (HOSPITAL_COMMUNITY): Payer: Self-pay

## 2022-06-20 ENCOUNTER — Other Ambulatory Visit: Payer: No Typology Code available for payment source

## 2022-06-20 DIAGNOSIS — I480 Paroxysmal atrial fibrillation: Secondary | ICD-10-CM

## 2022-06-20 DIAGNOSIS — Z79899 Other long term (current) drug therapy: Secondary | ICD-10-CM

## 2022-06-20 DIAGNOSIS — I2510 Atherosclerotic heart disease of native coronary artery without angina pectoris: Secondary | ICD-10-CM

## 2022-06-20 DIAGNOSIS — I5033 Acute on chronic diastolic (congestive) heart failure: Secondary | ICD-10-CM

## 2022-06-20 DIAGNOSIS — I10 Essential (primary) hypertension: Secondary | ICD-10-CM

## 2022-06-20 DIAGNOSIS — D6869 Other thrombophilia: Secondary | ICD-10-CM

## 2022-06-20 DIAGNOSIS — Q264 Anomalous pulmonary venous connection, unspecified: Secondary | ICD-10-CM

## 2022-06-21 LAB — BASIC METABOLIC PANEL
BUN/Creatinine Ratio: 18 (ref 9–20)
BUN: 24 mg/dL (ref 6–24)
CO2: 24 mmol/L (ref 20–29)
Calcium: 9.6 mg/dL (ref 8.7–10.2)
Chloride: 97 mmol/L (ref 96–106)
Creatinine, Ser: 1.33 mg/dL — ABNORMAL HIGH (ref 0.76–1.27)
Glucose: 164 mg/dL — ABNORMAL HIGH (ref 70–99)
Potassium: 3.9 mmol/L (ref 3.5–5.2)
Sodium: 139 mmol/L (ref 134–144)
eGFR: 63 mL/min/{1.73_m2} (ref >59)

## 2022-06-23 ENCOUNTER — Encounter: Payer: Self-pay | Admitting: *Deleted

## 2022-06-23 ENCOUNTER — Telehealth: Payer: Self-pay | Admitting: *Deleted

## 2022-06-23 MED ORDER — TORSEMIDE 20 MG PO TABS: 20.0000 mg | ORAL_TABLET | Freq: Every day | ORAL | 1 refills | Status: DC

## 2022-06-23 NOTE — Telephone Encounter (Signed)
[name], LPN  3/49/1791  8:35 AM EDT Back to Top    Per Dr. Shari Prows, she provided verbal orders to now decrease the pts torsemide to taking 20 mg po daily, to allow renal function to recover.    Spoke back with the pts wife and endorsed to her that Dr. Shari Prows now wants to decrease his torsemide from 40 mg daily to 20 mg po daily. Confirmed the pharmacy of choice with the pts wife.  Wife verbalized understanding and agrees with this plan.  Wife is aware that Dr. Shari Prows will write the extension for return to work here soon.  Wife is aware that we will send her this letter via pts mychart once complete. Wife verbalized understanding and agrees with this plan.

## 2022-06-23 NOTE — Telephone Encounter (Signed)
-----   Message from [name], LPN sent at 2/77/8242  8:34 AM EDT ----- Please advise on short supporting letter in staff message indicating extension on pts return to work from Sept 4-Oct 4th to allow more recovery.   ----- Message ----- From: [name], MD Sent: 06/23/2022   8:11 AM EDT To: [name], LPN  His Cr has risen slightly compared to prior. How's his fluid status?

## 2022-06-26 ENCOUNTER — Encounter: Payer: Self-pay | Admitting: Cardiology

## 2022-07-14 ENCOUNTER — Telehealth (HOSPITAL_COMMUNITY): Payer: Self-pay

## 2022-07-14 NOTE — Telephone Encounter (Signed)
No response from pt regarding CR.  Closed referral.  

## 2022-07-22 ENCOUNTER — Encounter: Payer: Self-pay | Admitting: Cardiology

## 2022-07-30 ENCOUNTER — Telehealth: Payer: Self-pay | Admitting: *Deleted

## 2022-07-30 NOTE — Telephone Encounter (Signed)
   Pre-operative Risk Assessment    Patient Name: Christopher [name]  DOB: 09/22/1966 MRN: [id number]     Request for Surgical Clearance    Procedure:  Dental Extraction - Amount of Teeth to be Pulled:  1 TOOTH #16  Date of Surgery:  Clearance TBD                                 Surgeon:  Arnetha Courser, DDS, MD Surgeon's Group or Practice Name:  [facility] Phone number:  [phone] Fax number:  [phone]   Type of Clearance Requested:   - Medical    Type of Anesthesia:   IV SEDATION WITH LOCAL ANESTHESIA CONTAINING EPINEPHRINE   Additional requests/questions:    Astrid Divine   07/30/2022, 6:54 AM

## 2022-07-30 NOTE — Telephone Encounter (Signed)
   Primary Cardiologist: Freada Bergeron, MD  Chart reviewed as part of pre-operative protocol coverage. Simple dental extractions are considered low risk procedures per guidelines and generally do not require any specific cardiac clearance. It is also generally accepted that for simple extractions and dental cleanings, there is no need to interrupt blood thinner therapy.   SBE prophylaxis is not required for the patient.  I will route this recommendation to the requesting party via Epic fax function and remove from pre-op pool.  Please call with questions.  Emmaline Life, NP-C  07/30/2022, 8:47 AM [address], [address] Office [phone] Fax [phone]

## 2022-09-08 NOTE — Progress Notes (Unsigned)
Cardiology Office Note:    Date:  09/11/2022   ID:  Christopher [name], DOB 1966-06-11, MRN [id number]  PCP:  [name], MD   [facility] Providers Cardiologist:  [name], MD     Referring MD: [name], MD   Chief Complaint: follow-up PAF, anomalous pulmonary venous return repair  History of Present Illness:    Christopher [name] is a very pleasant 56 y.o. male with a hx of HTN, anomalous pulmonary venous return, PAF s/p ablation 08/2021 and LAA clipping 04/2022, OSA, and leg edema.   Previously seen in 2017 by [name] for chest pain. He underwent ETT which was normal with no ischemia.  TTE ED revealed LVEF 55%, mild LVH, no significant valve disease. Referred back to cardiology by PCP for management of atrial fibrillation and seen by Dr. Shari Prows on 03/19/2021.  Had been started on Xarelto.  CHA2DS2-VASc score of 1.  Reported a few episodes of presyncope, dizziness while driving and episodes of feeling his heart pounding frequently.  Also noted LE swelling.  TTE 04/18/2021 revealed LVEF 55 to 60%, normal RV, no significant valve disease. His metoprolol was increased and he was switched from Xarelto to Eliquis.  Seen by Dr. Johney Frame, EP, and underwent successful A-fib ablation on 09/24/2021.  Following his ablation he developed abdominal bloating and lower extremity edema for which she was started on Lasix. He remained volume overloaded in follow-up and Lasix was increased to 40 mg twice daily. 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  Follow-up CMR revealed mild RV dilation with moderate systolic dysfunction with Qp:Qs 1.3.  He underwent RHC 01/06/2022 which showed Qp:Qs 1.7 as well as moderately elevated PCWP.  Cath with mild nonobstructive disease.  He underwent repair of isolated partial anomalous pulmonary venous return on 04/09/2022 at [facility]. Following discharge he continued to have LE edema. Lasix  was switched to torsemide and he was started on intermittent metolazone.  Last cardiology clinic visit was 05/30/2022 with Dr. Shari Prows. He reported swelling had improved on current regimen of torsemide 40 mg in the morning and metolazone 2.5 mg weekly.  BP soft with systolic 90s to low 100s. Continuing to have dyspnea on exertion was unchanged. He had LAA clipping during surgery and apixaban was stopped.  At the office visit, he was maintaining sinus rhythm.  He was advised to stop metoprolol due to low blood pressure. Continue flecainide 75 mg twice daily and take metolazone only as needed. Return in 3 month was advised.  Creatinine was elevated on bmet 06/20/22 and he was advised to reduce torsemide to 20 mg daily.   Today, he is here for follow-up accompanied by his wife.  He reports he has been feeling well.  Has not taken metolazone in a few months. Recently drove to [place] and did not take torsemide and had no problems with edema. [place]. Returned to work on August 7, work is physical but he does not exercise otherwise. Notes home HR typically in the 90s.  Does not feel palpitations or episodes of heart racing.  He denies shortness of breath, chest pain, orthopnea, PND, presyncope, syncope, palpitations.  Past Medical History:  Diagnosis Date   A-fib (HCC)    on Eliquis, flecainide and metoprolol   Diverticulitis    Hypercholesteremia    Hypertension    Low back pain    recurrent  OSA (obstructive sleep apnea)    Mild    Past Surgical History:  Procedure Laterality Date   ATRIAL FIBRILLATION ABLATION N/A 09/24/2021   Procedure: ATRIAL FIBRILLATION ABLATION;  Surgeon: [name], MD;  Location: [facility]AS[facility];  Service: Cardiovascular;  Laterality: N/A;   BICEPT TENODESIS Right 08/22/2021   Procedure: OPEN BICEPS TENODESIS;  Surgeon: [name], MD;  Location: [facility];  Service: Orthopedics;  Laterality: Right;    CARDIOVERSION N/A 05/17/2021   Procedure: CARDIOVERSION;  Surgeon: [name], MD;  Location: [facility] ENDOSCOPY;  Service: Cardiovascular;  Laterality: N/A;   LAPAROSCOPIC SIGMOID COLECTOMY     SHOULDER ARTHROSCOPY WITH SUBACROMIAL DECOMPRESSION Right 08/22/2021   Procedure: SHOULDER ARTHROSCOPY WITH SUBACROMIAL DECOMPRESSION;  Surgeon: [name], MD;  Location:  SURGERY CENTER;  Service: Orthopedics;  Laterality: Right;    Current Medications: Current Meds  Medication Sig   aspirin EC 81 MG tablet Take 81 mg by mouth daily. Swallow whole.   atorvastatin (LIPITOR) 20 MG tablet Take 20 mg by mouth at bedtime.   Coenzyme Q10 (COQ-10 PO) Take 1 capsule by mouth daily. Qunol   flecainide (TAMBOCOR) 50 MG tablet Take 1.5 tablets (75 mg total) by mouth 2 (two) times daily.   potassium chloride SA (KLOR-CON M) 20 MEQ tablet Take 2 tablets (40 mEq total) by mouth daily.   spironolactone (ALDACTONE) 25 MG tablet Take 1 tablet (25 mg total) by mouth daily.   [DISCONTINUED] benzonatate (TESSALON) 100 MG capsule Take 100 mg by mouth as needed.   [DISCONTINUED] metolazone (ZAROXOLYN) 2.5 MG tablet Take 1 tablet (2.5 mg total) by mouth as needed.   [DISCONTINUED] torsemide (DEMADEX) 20 MG tablet Take 1 tablet (20 mg total) by mouth daily.     Allergies:   Patient has no known allergies.   Social History   Socioeconomic History   Marital status: Married    Spouse name: Not on file   Number of children: Not on file   Years of education: Not on file   Highest education level: Not on file  Occupational History   Not on file  Tobacco Use   Smoking status: Every Day    Packs/day: 1.00    Types: Cigarettes   Smokeless tobacco: Never   Tobacco comments:    Smokes 3/4-1ppd  Substance and Sexual Activity   Alcohol use: Yes    Alcohol/week: 1.0 - 2.0 standard drink of alcohol    Types: 1 - 2 Glasses of wine per week    Comment: SOCIAL   Drug use: No   Sexual activity: Yes  Other  Topics Concern   Not on file  Social History Narrative   Not on file   Social Determinants of Health   Financial Resource Strain: Not on file  Food Insecurity: Not on file  Transportation Needs: Not on file  Physical Activity: Not on file  Stress: Not on file  Social Connections: Not on file     Family History: The patient's family history is negative for Heart attack.  ROS:   Please see the history of present illness.   All other systems reviewed and are negative.  Labs/Other Studies Reviewed:    The following studies were reviewed today:  CMR 10/29/21  1.  Normal LV size and systolic function, EF 56%.   2. Relative to the LV, the RV was mildly dilated with moderate systolic dysfunction, EF calculated to 30%.   3. The right upper pulmonary vein  drains to the SVC (views not ideal, did not have MRA done). I do not see a definite atrial septal defect. If there is high concern for ASD, TEE would be the best way to visualize. Qp/Qs is mildlly elevated at 1.3.   4. No myocardial LGE, so no definitive evidence for prior MI, infiltrative disease, or myocarditis.   CCTA Morph/Pulm Vein 09/17/21  1. 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 The remainder of the pulmonary veins have normal drainage with measurements as above. There is an outpouching in the superior LA without clear connection to the right atrium, no clear ASD or contrast flow seen in these images.   2.  Mild enlargement of RA and RV.   3. There is no thrombus in the left atrial appendage.   4. The esophagus runs in the left atrial midline and is not in proximity to any of the pulmonary vein ostia.   5. Normal coronary origin.   6. CAC score of 5 which is 65th percentile for age-, race-, and sex-matched controls.  Echo 04/19/21   1. Left ventricular ejection fraction, by estimation, is 55 to 60%. The  left ventricle has normal function. The left ventricle has no regional  wall motion  abnormalities. Left ventricular diastolic function could not  be evaluated.   2. Right ventricular systolic function is normal. The right ventricular  size is normal. There is normal pulmonary artery systolic pressure.   3. The mitral valve is normal in structure. Trivial mitral valve  regurgitation. No evidence of mitral stenosis.   4. The aortic valve is tricuspid. Aortic valve regurgitation is not  visualized. No aortic stenosis is present.   5. The inferior vena cava is normal in size with greater than 50%  respiratory variability, suggesting right atrial pressure of 3 mmHg.   Comparison(s): No significant change from prior study. 11/03/16 EF 55%. PA  pressure 34mmHg.   Conclusion(s)/Recommendation(s): Normal biventricular function without  evidence of hemodynamically significant valvular heart disease.  Recent Labs: 05/07/2022: NT-Pro BNP 134 05/21/2022: Magnesium 2.0 06/20/2022: BUN 24; Creatinine, Ser 1.33; Potassium 3.9; Sodium 139  Recent Lipid Panel No results found for: "CHOL", "TRIG", "HDL", "CHOLHDL", "VLDL", "LDLCALC", "LDLDIRECT"   Risk Assessment/Calculations:     Physical Exam:    VS:  BP 112/72   Pulse 99   Ht 5\' 6"  (1.676 m)   Wt 207 lb (93.9 kg)   SpO2 94%   BMI 33.41 kg/m     Wt Readings from Last 3 Encounters:  09/11/22 207 lb (93.9 kg)  05/30/22 205 lb 12.8 oz (93.4 kg)  05/06/22 200 lb 3.2 oz (90.8 kg)     GEN:  Well nourished, well developed in no acute distress HEENT: Normal NECK: No JVD; No carotid bruits CARDIAC: RRR, no murmurs, rubs, gallops RESPIRATORY:  Clear to auscultation without rales, wheezing or rhonchi  ABDOMEN: Soft, non-tender, non-distended MUSCULOSKELETAL:  Skin taunt, erythematous. No obvious edema. 2+ pedal pulses, equal bilaterally SKIN: Warm and dry NEUROLOGIC:  Alert and oriented x 3 PSYCHIATRIC:  Normal affect   EKG:  EKG is ordered today.  The ekg ordered today demonstrates NSR at 99 bpm, LAD, no ST  abnormality   Diagnoses:    1. Chronic diastolic heart failure (HCC)   2. Coronary artery disease involving native coronary artery of native heart without angina pectoris   3. Primary hypertension   4. Anomalous pulmonary venous return   5. High risk medication use    Assessment and  Plan:     Chronic HFpEF: LVEF 56% on CMR 10/2021. Mild LE edema, otherwise no evidence of volume overload on exam. He denies dyspnea, orthopnea, PND. He would like to reduce or discontinue torsemide. I have asked him to take 1/2 tablet daily or 20 mg every other day for a while to see how he tolerates.  Continue spironolactone.   PAF: He had LAA clipping during open repair of anomalous pulmonary venous return.  No longer on anticoagulation. No return of a fib to his awareness. He remains on flecainide.  QTc stable on EKG. I have asked him to send recent labs from PCP.   Hypertension: BP is well controlled.  He does not have any symptoms of lightheadedness, presyncope, syncope.  CAD without angina: Nonobstructive mild CAD on cath 12/2021. He denies chest pain, dyspnea, or other symptoms concerning for angina.  No indication for further ischemic evaluation at this time.  Secondary prevention emphasized including 150 minutes of moderate intensity exercise each week, heart healthy mostly whole food diet avoiding saturated fat and processed foods. Need to get LDL < 70. Encouraged him to work hard on diet, exercise. Continue aspirin, atorvastatin.  Anomalous RUPV: s/p repair of partial anomalous pulmonary venous connection at [facility] 04/2022.  Has been discharged by [facility].  No symptoms of shortness of breath, volume overload.     Disposition: 4 months with Dr. Shari Prows  Medication Adjustments/Labs and Tests Ordered: Current medicines are reviewed at length with the patient today.  Concerns regarding medicines are outlined above.  Orders Placed This Encounter  Procedures   EKG 12-Lead   Meds ordered this encounter   Medications   torsemide (DEMADEX) 20 MG tablet    Sig: Take one (1) tablet by mouth (20 mg ) every other day or take one half (0.5) tablet by mouth (10 mg) daily. If swelling increases take one tablet daily.    Dispense:  90 tablet    Refill:  1    Dose decreased    Patient Instructions  Medication Instructions:   CHANGE Torsemide Take one (1) tablet by mouth (20 mg ) every other day or take one half (0.5) tablet by mouth (10 mg) daily. If swelling increases take one tablet daily.   *If you need a refill on your cardiac medications before your next appointment, please call your pharmacy*  Lab Work:  Patient will call or send mychart message.   If you have labs (blood work) drawn today and your tests are completely normal, you will receive your results only by: MyChart Message (if you have MyChart) OR A paper copy in the mail If you have any lab test that is abnormal or we need to change your treatment, we will call you to review the results.  Testing/Procedures:  None ordered.  Follow-Up: At [facility] you and your health needs are our priority.  As part of our continuing mission to provide you with exceptional heart care, we have created designated Provider Care Teams.  These Care Teams include your primary Cardiologist (physician) and Advanced Practice Providers (APPs -  Physician Assistants and Nurse Practitioners) who all work together to provide you with the care you need, when you need it.  We recommend signing up for the patient portal called "MyChart".  Sign up information is provided on this After Visit Summary.  MyChart is used to connect with patients for Virtual Visits (Telemedicine).  Patients are able to view lab/test results, encounter notes, upcoming appointments, etc.  Non-urgent messages  can be sent to your provider as well.   To learn more about what you can do with MyChart, go to [email or web address].    Your next appointment:   4  month(s)  The format for your next appointment:   In Person  Provider:   [name], MD     Important Information About Sugar         Signed, Levi Aland, NP  09/11/2022 5:49 PM    [facility]

## 2022-09-11 ENCOUNTER — Ambulatory Visit: Payer: No Typology Code available for payment source | Attending: Nurse Practitioner | Admitting: Nurse Practitioner

## 2022-09-11 ENCOUNTER — Encounter: Payer: Self-pay | Admitting: Nurse Practitioner

## 2022-09-11 VITALS — BP 112/72 | HR 99 | Ht 66.0 in | Wt 207.0 lb

## 2022-09-11 DIAGNOSIS — Q264 Anomalous pulmonary venous connection, unspecified: Secondary | ICD-10-CM | POA: Diagnosis not present

## 2022-09-11 DIAGNOSIS — I2510 Atherosclerotic heart disease of native coronary artery without angina pectoris: Secondary | ICD-10-CM

## 2022-09-11 DIAGNOSIS — I5032 Chronic diastolic (congestive) heart failure: Secondary | ICD-10-CM | POA: Diagnosis not present

## 2022-09-11 DIAGNOSIS — Z79899 Other long term (current) drug therapy: Secondary | ICD-10-CM

## 2022-09-11 DIAGNOSIS — I10 Essential (primary) hypertension: Secondary | ICD-10-CM | POA: Diagnosis not present

## 2022-09-11 MED ORDER — TORSEMIDE 20 MG PO TABS: ORAL_TABLET | ORAL | 1 refills | Status: AC

## 2022-09-11 NOTE — Patient Instructions (Signed)
Medication Instructions:   CHANGE Torsemide Take one (1) tablet by mouth (20 mg ) every other day or take one half (0.5) tablet by mouth (10 mg) daily. If swelling increases take one tablet daily.   *If you need a refill on your cardiac medications before your next appointment, please call your pharmacy*  Lab Work:  Patient will call or send mychart message.   If you have labs (blood work) drawn today and your tests are completely normal, you will receive your results only by: MyChart Message (if you have MyChart) OR A paper copy in the mail If you have any lab test that is abnormal or we need to change your treatment, we will call you to review the results.  Testing/Procedures:  None ordered.  Follow-Up: At [facility], you and your health needs are our priority.  As part of our continuing mission to provide you with exceptional heart care, we have created designated Provider Care Teams.  These Care Teams include your primary Cardiologist (physician) and Advanced Practice Providers (APPs -  Physician Assistants and Nurse Practitioners) who all work together to provide you with the care you need, when you need it.  We recommend signing up for the patient portal called "MyChart".  Sign up information is provided on this After Visit Summary.  MyChart is used to connect with patients for Virtual Visits (Telemedicine).  Patients are able to view lab/test results, encounter notes, upcoming appointments, etc.  Non-urgent messages can be sent to your provider as well.   To learn more about what you can do with MyChart, go to [email or web address].    Your next appointment:   4 month(s)  The format for your next appointment:   In Person  Provider:   [name], MD     Important Information About Sugar

## 2022-09-12 ENCOUNTER — Encounter: Payer: Self-pay | Admitting: Cardiology

## 2022-09-15 ENCOUNTER — Other Ambulatory Visit: Payer: Self-pay

## 2022-09-15 MED ORDER — SPIRONOLACTONE 25 MG PO TABS: 25.0000 mg | ORAL_TABLET | Freq: Every day | ORAL | 3 refills | Status: DC

## 2022-11-18 IMAGING — MR MR SHOULDER*R* W/O CM
4 of 5 series · 20 of 40 positions shown · non-contrast
Comparison: None.

CLINICAL DATA: Right shoulder pain and decreased range of motion
for 3 weeks since a lifting injury.

EXAM:
MRI OF THE RIGHT SHOULDER WITHOUT CONTRAST
TECHNIQUE: Multiplanar, multisequence MR imaging of the shoulder was performed.
No intravenous contrast was administered.

[Series 6: T2 fat-sat · axial · right · 3.0mm · 0.50mm/px · z∈[-33,+61]mm · 7 of 30 slices shown (1 of 3)]
[im 1/30]
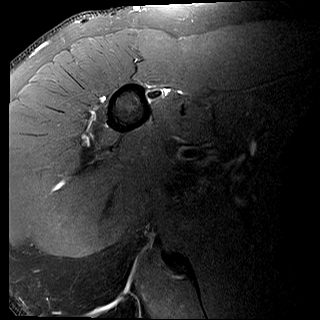
[im 4/30]
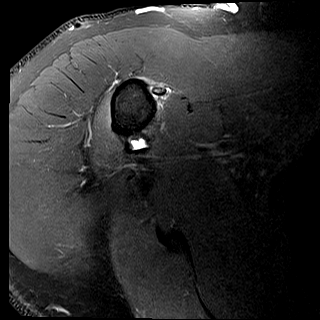
[im 10/30]
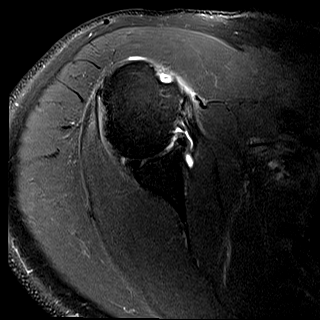
[im 13/30]
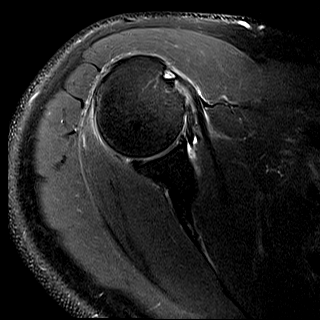
[im 17/30]
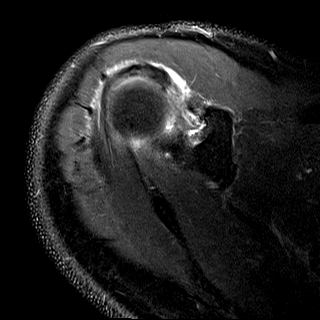
[im 20/30]
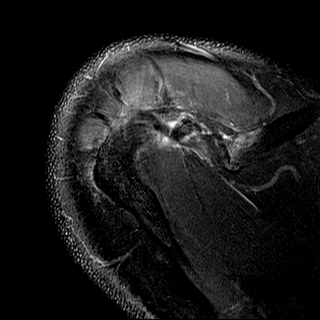
[im 26/30]
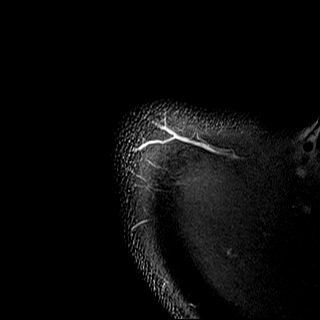

[Series 7: T2 fat-sat · sagittal · right · 4.0mm · 0.25mm/px · 3 of 24 slices shown (2 of 3)]
[im 4/24]
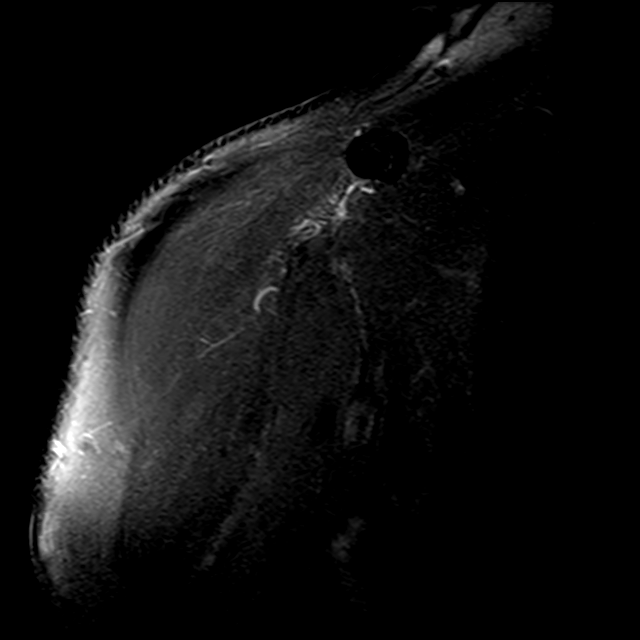
[im 12/24]
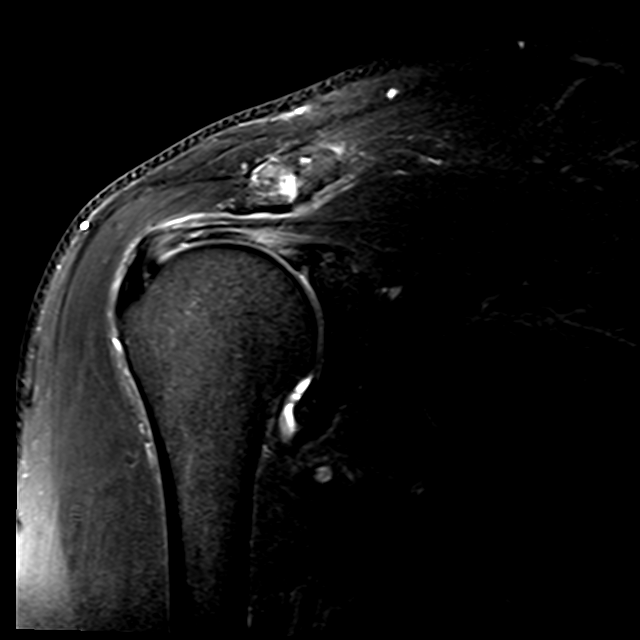
[im 20/24]
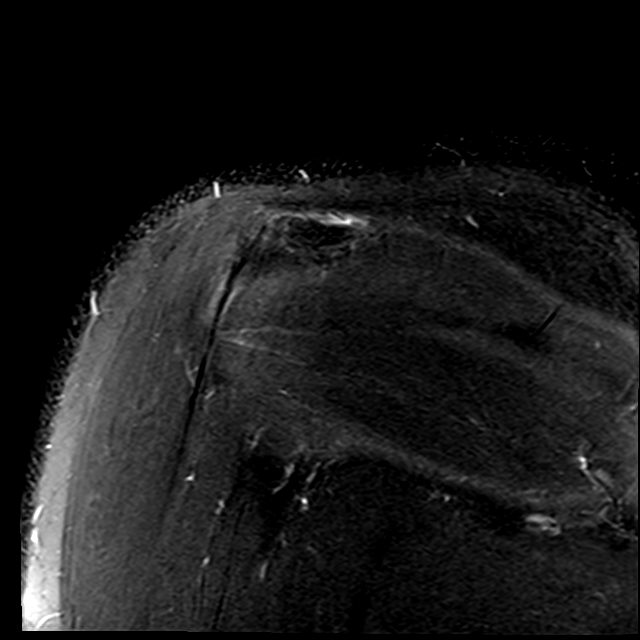

[Series 8: PD · sagittal · right · 4.0mm · 0.25mm/px · 7 of 24 slices shown]
[im 1/24]
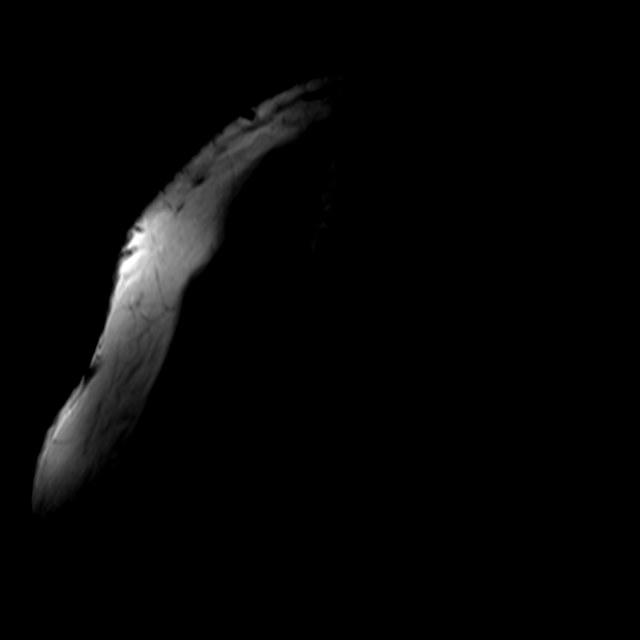
[im 4/24]
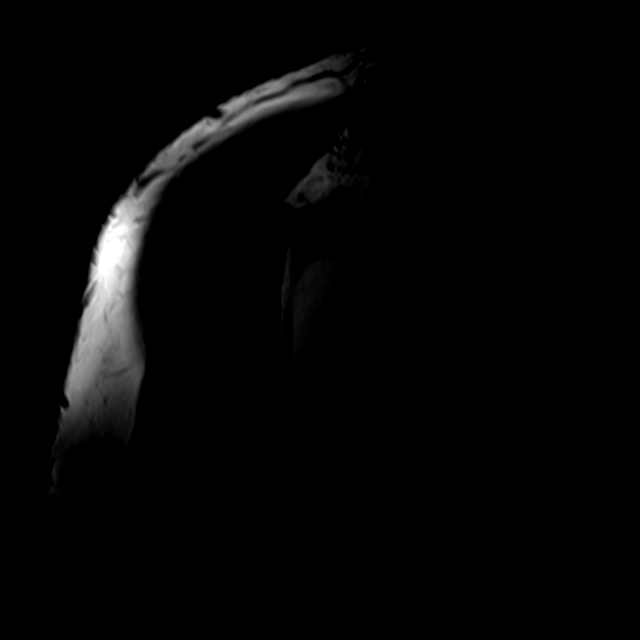
[im 8/24]
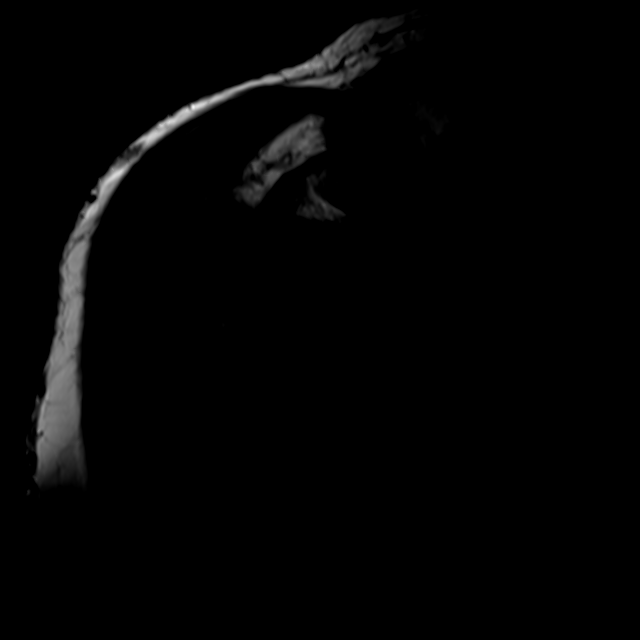
[im 12/24]
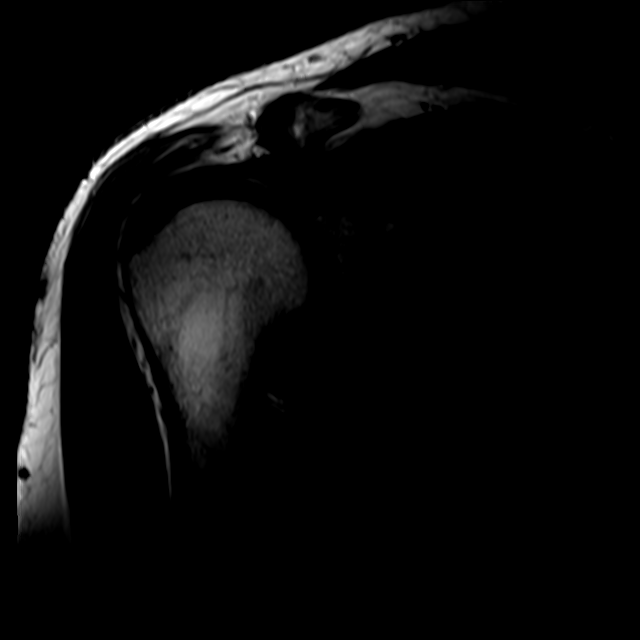
[im 16/24]
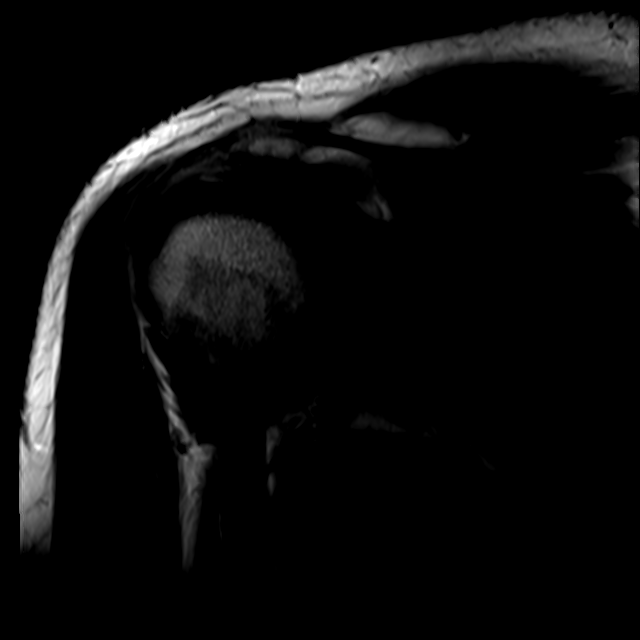
[im 20/24]
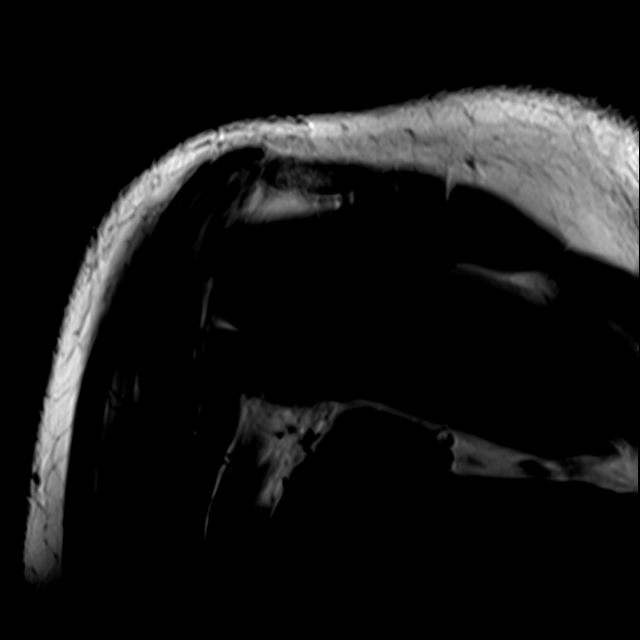
[im 24/24]
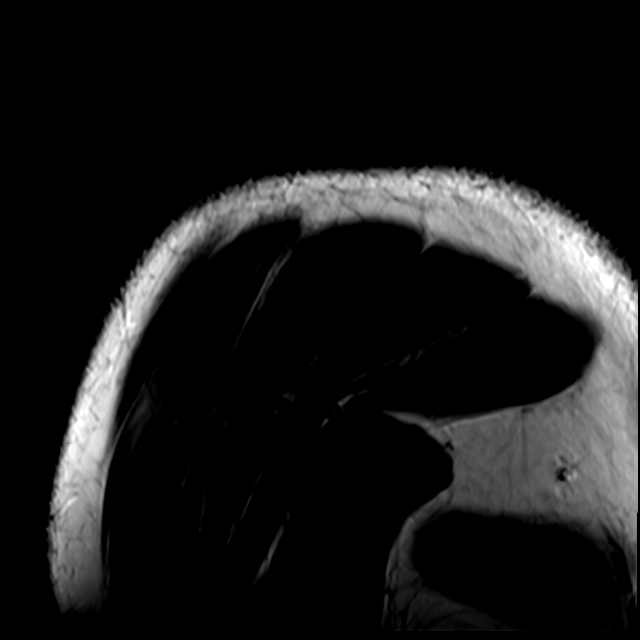

[Series 9: T2 fat-sat · coronal · right · 4.0mm · 0.50mm/px · 3 of 26 slices shown (3 of 3)]
[im 4/26]
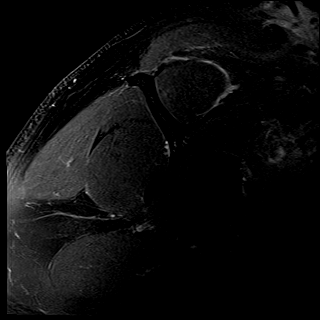
[im 15/26]
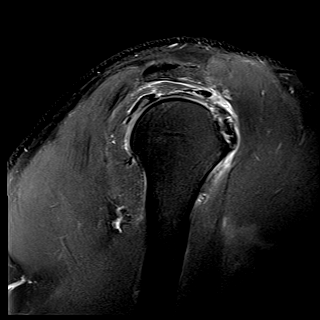
[im 22/26]
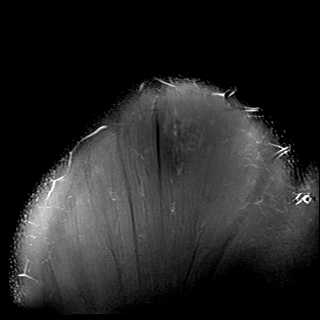

[20 of 40 positions shown; findings below may reference images not displayed]

FINDINGS: Rotator cuff: Thickening and heterogeneously increased T2 signal in
the cuff tendons are worst in the subscapularis. The superior fibers
of the distal subscapularis are thinned compatible with undersurface
tearing. There is laminar retraction of approximately 1 cm. More
inferiorly, there is a small longitudinal split tear of the tendon.
The supraspinatus and infraspinatus are intact.

Muscles:  Normal without atrophy or focal lesion.

Biceps long head: The tendon is completely torn from the superior
labrum and retracted 4-5 cm below the top of the humeral head. There
is some edema in the tendon stump and fluid in the tendon sheath
compatible with recent tear.

Acromioclavicular Joint: Moderate osteoarthritis type 1 acromion. A
small to moderate volume of fluid is seen in the
subacromial/subdeltoid bursa.

Glenohumeral Joint: Appears normal.

Labrum: There is a small focus of edema in the superior labrum at
the biceps tendon attachment site from the patient's tendon tear.

Bones:  No fracture, contusion or focal lesion.

Other: None.
IMPRESSION: Complete tear of the long head of biceps from the superior labrum
appears acute or early subacute. Tendon retraction is 4-5 cm below
the top of the humeral head.

Rotator cuff tendinopathy appears worst in the subscapularis. There
is a undersurface tear of the superior fibers of the subscapularis
with approximately 1 cm of retraction but no atrophy. Small
interstitial tear more inferiorly in the tendon is also identified.

Moderate acromioclavicular osteoarthritis.

Fluid in the subacromial/subdeltoid bursa compatible with bursitis.

## 2022-12-04 ENCOUNTER — Encounter (HOSPITAL_COMMUNITY): Payer: Self-pay | Admitting: *Deleted

## 2023-01-11 NOTE — Progress Notes (Signed)
Cardiology Office Note:    Date:  01/15/2023   ID:  Darcella Gasman, DOB 1966-10-21, MRN [id number]  PCP:  Mckinley Jewel, MD   [facility] Providers Cardiologist:  Freada Bergeron, MD {   Referring MD: Kelton Pillar, MD    History of Present Illness:    Christopher [name] is a 57 y.o. male with a hx of HTN, anomalous pulmonary venous return and paroxysmal atrial fibrillation who presents to clinic for follow-up.   Patient was previously seen in 2017 by Dr. Irish Lack for chest pain. He underwent ETT which was normal with no ischemia. TTE also obtained which revealed LVEF 55%, mild LVH, no significant valve disease.  Was seen in clinic 03/19/21 for symptomatic Afib. 3 day zio demonstrated persistent Afib throughout with average HR 106bpm. TTE 04/18/21 with LVEF 55-60%, normal RV, no significant valve disease. We increased his metop to 75mg  daily and changed xarelto to apixaban.  He followed up with Dr. Rayann Heman and underwent successful afib ablation on 09/24/21. Following his ablation, he developed abdominal bloating and lower extremity edema for which he was started on lasix. Last saw Dr. Curt Bears on 10/15/21 where he was continued on lasix. Then had follow-up with Roderic Palau where he contiued to look overloaded. His lasix was increased to 40mg  PO BID.  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 Follow-up CMR revealed mild RV dilation with moderate systolic dysfunction with Qp:Qs 1.3. He now returns to clinic for follow-up.  Saw Dr. Vella Kohler and underwent [place] 01/06/22 which showed Qp:Qs 1.7 as well as moderately elevated PCWP. Cath with mild, nonobstructive disease.   He underwent repair of isolated partial anomalous pulmonary venous return on 04/09/22 with Dr. Granville Lewis.   Was seen in clinic 05/06/22 where he was having significant LE edema. He was otherwise recovering well from his  surgery. We changed his lasix to torsemide and started intermittent metolazone.  Of note, he had LAA clipping during surgery. His apixaban has since been stopped. He is also off amlodipine and his metop was changed to tartrate.   Was last seen in clinic on 08/2022 where LE edema was significantly improved. Had returned to work without issue.   Today, the patient overall feels well. No chest pain, SOB, orthopnea or PND. LE edema is well controlled on torsemide which he is taking 3x/week. He has returned to work and feels okay with activity with only mild dyspnea. No palpitations. Tolerating medications as prescribed. Has been on aspirin.   Of note, his wife is concerned that the patient is having periods where he is mumbling to himself or raising his hands and is completely unaware that has been ongoing since his surgery. They are inquiring on if he needs to see Neurology for further evaluation.   Past Medical History:  Diagnosis Date   A-fib ([place])    on Eliquis, flecainide and metoprolol   Diverticulitis    Hypercholesteremia    Hypertension    Low back pain    recurrent   OSA (obstructive sleep apnea)    Mild    Past Surgical History:  Procedure Laterality Date   ATRIAL FIBRILLATION ABLATION N/A 09/24/2021   Procedure: ATRIAL FIBRILLATION ABLATION;  Surgeon: Thompson Grayer, MD;  Location: [facility];  Service: Cardiovascular;  Laterality: N/A;   BICEPT TENODESIS Right 08/22/2021   Procedure: OPEN BICEPS TENODESIS;  Surgeon: Hiram Gash, MD;  Location: [facility];  Service: Orthopedics;  Laterality: Right;   CARDIOVERSION N/A 05/17/2021   Procedure: CARDIOVERSION;  Surgeon: Pixie Casino, MD;  Location: [facility];  Service: Cardiovascular;  Laterality: N/A;   LAPAROSCOPIC SIGMOID COLECTOMY     SHOULDER ARTHROSCOPY WITH SUBACROMIAL DECOMPRESSION Right 08/22/2021   Procedure: SHOULDER ARTHROSCOPY WITH SUBACROMIAL DECOMPRESSION;  Surgeon: Hiram Gash, MD;   Location: [facility];  Service: Orthopedics;  Laterality: Right;    Current Medications: Current Meds  Medication Sig   aspirin EC 81 MG tablet Take 81 mg by mouth daily. Swallow whole.   atorvastatin (LIPITOR) 20 MG tablet Take 20 mg by mouth at bedtime.   Coenzyme Q10 (COQ-10 PO) Take 1 capsule by mouth daily. Qunol   flecainide (TAMBOCOR) 50 MG tablet Take 1.5 tablets (75 mg total) by mouth 2 (two) times daily.   [START ON 01/16/2023] potassium chloride SA (KLOR-CON M) 20 MEQ tablet Take 2 tablets (40 mEq total) by mouth 3 (three) times a week. Take on Mon, Wed, and Fri with Demadex dosing.   spironolactone (ALDACTONE) 25 MG tablet Take 1 tablet (25 mg total) by mouth daily.   torsemide (DEMADEX) 20 MG tablet Take one (1) tablet by mouth (20 mg ) every other day or take one half (0.5) tablet by mouth (10 mg) daily. If swelling increases take one tablet daily. (Patient taking differently: Take 20 mg by mouth. Take one (1) tablet by mouth (20 mg ) every other day or take one half (0.5) tablet by mouth (10 mg) daily. If swelling increases take one tablet daily.)   [DISCONTINUED] potassium chloride SA (KLOR-CON M) 20 MEQ tablet Take 2 tablets (40 mEq total) by mouth daily.     Allergies:   Patient has no known allergies.   Social History   Socioeconomic History   Marital status: Married    Spouse name: Not on file   Number of children: Not on file   Years of education: Not on file   Highest education level: Not on file  Occupational History   Not on file  Tobacco Use   Smoking status: Every Day    Packs/day: 1    Types: Cigarettes   Smokeless tobacco: Never   Tobacco comments:    Smokes 3/4-1ppd  Substance and Sexual Activity   Alcohol use: Yes    Alcohol/week: 1.0 - 2.0 standard drink of alcohol    Types: 1 - 2 Glasses of wine per week    Comment: SOCIAL   Drug use: No   Sexual activity: Yes  Other Topics Concern   Not on file  Social History Narrative    Not on file   Social Determinants of Health   Financial Resource Strain: Not on file  Food Insecurity: Not on file  Transportation Needs: Not on file  Physical Activity: Not on file  Stress: Not on file  Social Connections: Not on file     Family History: The patient's family history is negative for Heart attack.  ROS:   Please see the history of present illness.    Review of Systems  Constitutional:  Negative for chills and fever.  HENT:  Negative for sore throat.   Eyes:  Negative for blurred vision.  Respiratory:  Positive for cough and wheezing.   Cardiovascular:  Positive for leg swelling. Negative for chest pain, palpitations, orthopnea, claudication and PND.  Gastrointestinal:  Negative for blood in stool, melena, nausea and vomiting.  Genitourinary:  Negative  for hematuria.  Musculoskeletal:  Negative for falls.  Neurological:  Positive for speech change. Negative for dizziness and loss of consciousness.  Psychiatric/Behavioral:  Negative for substance abuse.     EKGs/Labs/Other Studies Reviewed:    The following studies were reviewed today: TTE 19-May-2022: Conclusions                                                              - Partial anomalous pulmonary venous return of right upper, right middle, and one      accessory pulmonary veins to the SVC/RA junction                                      S/P repair of isolated partial anomalous pulmonary venous return (04/09/22,            [place])                                                                            Right upper pulmonary vein appears patent by color and spectral Doppler                Trivial tricuspid regurgitation, peak RV-RA gradient at least 20 mmHg                  Mild right ventricular dilation and hypertrophy with normal right ventricular          systolic function                                                                      Normal left ventricular systolic function                                               No evidence of diastolic dysfunction                                                  No pericardial effusion                                                                 Findings  PROCEDURE INFORMATION  The image quality was fair.  post operative bandages, poor acoustic window,  and patient position.  The subcostal were limited.    SURGERIES AND INTERVENTIONS  The patient has undergone the following procedure: repair of partial anomalous pulmonary venous connection using a sutureless technique involving the right upper and right middle pulmonary veins.    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  Abdominal situs is not well visualized.    VEINS  There is a partial anomalous pulmonary venous connection.  One right and one left sided pulmonary vein are seen connecting to the left atrium.  The flow patterns in the right and left pulmonary veins are normal.    ATRIA  The atrial septum is not well visualized on the study, cannot rule out smaller defects or a patent foramen ovale.The right atrium is mildly dilated.Normal left atrial size.    INLETS/ATRIOVENTRICULAR VALVES  The tricuspid valve is normal.  Inadequate tricuspid valve regurgitation to estimate right ventricular systolic pressures.   Tricuspid valve regurgitation peak gradient is at least 20 mmHg.  No tricuspid valve obstruction, there is trace regurgitation.    No mitral valve obstruction, with trace regurgitation.  The mitral valve is normal.  VENTRICLES  Mildly dilated right ventricle.  Mild right ventricular hypertrophy.  Normal right ventricular systolic function.  The left ventricular morphology, size and systolic function are normal.  The ventricular septum appears intact.    OUTLETS/SEMILUNAR VALVES  The right ventricular outflow tract is unobstructed.The pulmonary valve is normal.  The left ventricular outflow tract  is unobstructed.No pulmonic valve obstruction.  Trace pulmonary valve regurgitation.  The aortic valve is tricuspid.  No aortic valve  obstruction.  No aortic valve regurgitation.  The aortic valve is normal.    CORONARY ARTERIES  Coronary arteries not imaged.    GREAT ARTERIES  The aortic root diameter at the sinuses of Valsalva is normal.  Normal main pulmonary and branch pulmonary arteries.  There is unobstructed aortic arch and branching not assessed.  There is no evidence of coarctation of the aorta.  There is no patent  ductus arteriosus.    EXTRACARDIAC  There is no pericardial effusion.  The left pleural space is not assessed.  There is no right sided pleural effusion.      RHC/LHC at [facility] 12/2021: Impressions:   Upper limits of normal right heart pressures with preserved cardiac output and moderately elevated pulmonary capillary wedge pressure. 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 This vessel was  cannulated and a selective angiogram performed. The calculated Qp:Qs was 1.7. Coronary angiography showed mild, hemodynamically insignificant coronary disease.   Recommendations:   Will review patient data with Dr. Raul Del and at combined [facility] on Friday to help decide the need for an operative intervention.  Cardiac MRA 10/29/21: FINDINGS: Limited images of the lung fields showed no gross abnormalities.   Normal left ventricular size and wall thickness. Normal wall motion with EF 56%. Relative to the left ventricle, the right ventricle was mildly dilated with moderat/ely reduced systolic function, EF [id number]. Normal left atrial size with mildly dilated right atrium. No ASD was visualized. The right upper pulmonary vein drains into the SVC. Trileaflet aortic valve with no stenosis, trivial aortic insufficiency. No significant mitral regurgitation noted.   First pass perfusion imaging looks normal with no  defect.   On delayed enhancement imaging, there was no myocardial late gadolinium enhancement (LGE).   MEASUREMENTS: MEASUREMENTS LVEDV 68 mL  LVSV 38 mL   LVEF 56%   RVEDV 141 mL RVSV 43 mL RVEF 30%   Aortic forward volume 28 mL   Aortic regurgitant fraction 6%   Pulmonary forward volume 37 mL   Pulmonic regurgitant fraction 4%   Qp/Qs 1.3   IMPRESSION: 1.  Normal LV size and systolic function, EF 0000000.   2. Relative to the LV, the RV was mildly dilated with moderate systolic dysfunction, EF calculated to 30%.   3. The right upper pulmonary vein drains to the SVC (views not ideal, did not have MRA done). I do not see a definite atrial septal defect. If there is high concern for ASD, TEE would be the best way to visualize. Qp/Qs is mildlly elevated at 1.3.   4. No myocardial LGE, so no definitive evidence for prior MI, infiltrative disease, or myocarditis.  Cardiac MRI 10/29/2021: FINDINGS: Limited images of the lung fields showed no gross abnormalities.   Normal left ventricular size and wall thickness. Normal wall motion with EF 56%. Relative to the left ventricle, the right ventricle was mildly dilated with moderately reduced systolic function, EF [id number]. Normal left atrial size with mildly dilated right atrium. No ASD was visualized. The right upper pulmonary vein drains into the SVC. Trileaflet aortic valve with no stenosis, trivial aortic insufficiency. No significant mitral regurgitation noted.   First pass perfusion imaging looks normal with no defect.   On delayed enhancement imaging, there was no myocardial late gadolinium enhancement (LGE).   MEASUREMENTS: MEASUREMENTS LVEDV 68 mL   LVSV 38 mL   LVEF 56%   RVEDV 141 mL RVSV 43 mL RVEF 30%   Aortic forward volume 28 mL   Aortic regurgitant fraction 6%   Pulmonary forward volume 37 mL   Pulmonic regurgitant fraction 4%   Qp/Qs 1.3   IMPRESSION: 1.  Normal LV size and systolic  function, EF 0000000.   2. Relative to the LV, the RV was mildly dilated with moderate systolic dysfunction, EF calculated to 30%.   3. The right upper pulmonary vein drains to the SVC (views not ideal, did not have MRA done). I do not see a definite atrial septal defect. If there is high concern for ASD, TEE would be the best way to visualize. Qp/Qs is mildlly elevated at 1.3.   4. No myocardial LGE, so no definitive evidence for prior MI, infiltrative disease, or myocarditis.   Atrial Fibrillation Ablation 09/24/2021: CONCLUSIONS: 1. Sinus rhythm upon presentation.   2. Intracardiac echo reveals a moderate sized left atrium with four separate pulmonary veins without evidence of pulmonary vein stenosis.  The right superior pulmonary vein was atretic and noted on cardiac CT due to anomalous anatomy.  3. Successful electrical isolation and anatomical encircling of all four pulmonary veins with radiofrequency current.    4. Cavo-tricuspid isthmus ablation was performed with complete bidirectional isthmus block achieved.  5. No inducible arrhythmias following ablation both on and off of Isuprel 6. No early apparent complications.  Cardiac CT 09/17/21:   IMPRESSION: 1. 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 The remainder of the pulmonary veins have normal drainage with measurements as above. There is an outpouching in the superior LA without clear connection to the right atrium, no clear ASD or contrast flow seen in these images.   2.  Mild enlargement of RA and RV.   3. There is no thrombus in the left atrial appendage.   4. The esophagus runs in the left atrial midline  and is not in proximity to any of the pulmonary vein ostia.   5. Normal coronary origin.   6. CAC score of 5 which is 65th percentile for age-, race-, and sex-matched controls.  Cardiac Monitor 04/05/21: Patch wear time was 3 days and 1 hour Patient was in continuous Afib throughout the monitoring period  (100% burden) with HR ranging from 62-164 with averag HR 106 Rare ventricular ectopy No significant pauses  TTE 04/18/21: IMPRESSIONS    1. Left ventricular ejection fraction, by estimation, is 55 to 60%. The  left ventricle has normal function. The left ventricle has no regional  wall motion abnormalities. Left ventricular diastolic function could not  be evaluated.   2. Right ventricular systolic function is normal. The right ventricular  size is normal. There is normal pulmonary artery systolic pressure.   3. The mitral valve is normal in structure. Trivial mitral valve  regurgitation. No evidence of mitral stenosis.   4. The aortic valve is tricuspid. Aortic valve regurgitation is not  visualized. No aortic stenosis is present.   5. The inferior vena cava is normal in size with greater than 50%  respiratory variability, suggesting right atrial pressure of 3 mmHg.   Comparison(s): No significant change from prior study. 11/03/16 EF 55%. PA  pressure 53mmHg.   TTE 11/03/16: Study Conclusions  - Left ventricle: The cavity size was normal. Wall thickness was    increased in a pattern of mild LVH. The estimated ejection    fraction was 55%. Wall motion was normal; there were no regional    wall motion abnormalities. Left ventricular diastolic function    parameters were normal.  - Aortic valve: There was no stenosis.  - Mitral valve: There was trivial regurgitation.  - Right ventricle: The cavity size was normal. Systolic function    was normal.  - Right atrium: The atrium was mildly dilated.  - Tricuspid valve: Peak RV-RA gradient (S): 31 mm Hg.  - Pulmonary arteries: PA peak pressure: 34 mm Hg (S).  - Inferior vena cava: The vessel was normal in size. The    respirophasic diameter changes were in the normal range (>= 50%),    consistent with normal central venous pressure.   ETT 12/05/16:  Blood pressure demonstrated a normal response to exercise. There was no ST segment  deviation noted during stress.   Good exercise capacity. Normal BP response to exertion. No ischemia.   EKG:  EKG is personally reviewed.  ECG with NSR with LVH, HR 84 02/07/2022: Sinus rhythm . Rate 61 bpm.  04/23/2021: atrial fibrillation with HR 91  Recent Labs: 05/07/2022: NT-Pro BNP 134 05/21/2022: Magnesium 2.0 06/20/2022: BUN 24; Creatinine, Ser 1.33; Potassium 3.9; Sodium 139  Recent Lipid Panel No results found for: "CHOL", "TRIG", "HDL", "CHOLHDL", "VLDL", "LDLCALC", "LDLDIRECT"   Risk Assessment/Calculations:    CHA2DS2-VASc Score = 1  {This indicates a 0.6% annual risk of stroke. The patient's score is based upon: CHF History: 0 HTN History: 0 Diabetes History: 0 Stroke History: 0 Vascular Disease History: 1 Age Score: 0 Gender Score: 0        Physical Exam:    VS:  BP 126/84   Pulse 91   Ht 5\' 6"  (1.676 m)   Wt 203 lb (92.1 kg)   SpO2 95%   BMI 32.77 kg/m     Wt Readings from Last 3 Encounters:  01/15/23 203 lb (92.1 kg)  09/11/22 207 lb (93.9 kg)  05/30/22 205 lb 12.8 oz (  93.4 kg)     GEN:  Well appearing, NAD HEENT: Normal NECK: No JVD; No carotid bruits CARDIAC: RRR, no murmurs, rubs, gallops RESPIRATORY:  Scattered expiratory wheezing ABDOMEN: Soft, non-tender, non-distended MUSCULOSKELETAL:  Trace LE edema, warm SKIN: Warm and dry NEUROLOGIC:  Alert and oriented x 3 PSYCHIATRIC:  Normal affect   ASSESSMENT:    1. Chronic diastolic heart failure ([place])   2. Coronary artery disease involving native coronary artery of native heart without angina pectoris   3. Primary hypertension   4. Anomalous pulmonary venous return   5. Secondary hypercoagulable state ([place])   6. Persistent atrial fibrillation ([place])   7. Episode of change in speech       PLAN:    In order of problems listed above:  #Chronic Diastolic HF: TTE [id number] with normal LVEF, patent repair of RUPV, trivail TR, mild RV dilation and hypertrophy with normal systolic  function. Had significant LE edema post-op which has now significantly improved. Will continue meds as below. -Continue torsemide 20mg  M,W, F -Continue potassium to 16mEq with torsemide -Continue spiro 25mg  daily -Off metop due to low blood pressures -Low Na diet -Monitor daily weights -Check BMET next week  #Anomalous RUPV return: Noted on CTA obtained prior to Afib ablation. Follow-up CMR with mildly dilated RV with moderate systolic dysfunction and Qp:Qs 1.3. RHC with ADHD 12/2021 with Qp:Qs 1.7. No ASD visualized. Other veins with normal venous return. Given degree of shunting on RHC as well as evidence of RV enlargement, he underwent surgical correction at [facility] on 04/09/22. -S/p surgery at [facility]; doing well post-op  #Paroxysmal Afib: #Palpitations: CHADs-vasc 1. Now s/p Afib ablation on 08/2021 and LAA clipping with surgery. Doing well without palpitations. Now off apixaban. -Off apixaban -Stopped metop due to low blood pressures -Continue flecainide 75mg  BID -Followed by Afib clinic  #HTN: -Continue spiro 25mg  daily -BP well controlled and at goal <130/90  #HLD: #Coronary Calcification: LDL 113 on 02/26/21. Ca score 5 (65% age, race and gender). -Continue lipitor 20mg  daily -Check lipids once fasting -Goal LDL<70  #Behavioral Changes: Has noticed episodes where he is mumbling to himself and staring at his hands and he is unaware this is going on. He is inquiring about seeing a Neurologist.  -Will refer to Neuro at that time   Medication Adjustments/Labs and Tests Ordered: Current medicines are reviewed at length with the patient today.  Concerns regarding medicines are outlined above.  Orders Placed This Encounter  Procedures   Basic metabolic panel   Lipid Profile   Ambulatory referral to Neurology   Meds ordered this encounter  Medications   potassium chloride SA (KLOR-CON M) 20 MEQ tablet    Sig: Take 2 tablets (40 mEq total) by mouth 3 (three) times a week.  Take on Mon, Wed, and Fri with Demadex dosing.    Dispense:  90 tablet    Refill:  1    Dose decrease     There are no Patient Instructions on file for this visit.

## 2023-01-15 ENCOUNTER — Encounter: Payer: Self-pay | Admitting: Cardiology

## 2023-01-15 ENCOUNTER — Ambulatory Visit: Payer: No Typology Code available for payment source | Attending: Cardiology | Admitting: Cardiology

## 2023-01-15 VITALS — BP 126/84 | HR 91 | Ht 66.0 in | Wt 203.0 lb

## 2023-01-15 DIAGNOSIS — R4789 Other speech disturbances: Secondary | ICD-10-CM

## 2023-01-15 DIAGNOSIS — I5032 Chronic diastolic (congestive) heart failure: Secondary | ICD-10-CM

## 2023-01-15 DIAGNOSIS — Q264 Anomalous pulmonary venous connection, unspecified: Secondary | ICD-10-CM | POA: Diagnosis not present

## 2023-01-15 DIAGNOSIS — I10 Essential (primary) hypertension: Secondary | ICD-10-CM

## 2023-01-15 DIAGNOSIS — I2510 Atherosclerotic heart disease of native coronary artery without angina pectoris: Secondary | ICD-10-CM

## 2023-01-15 DIAGNOSIS — I4819 Other persistent atrial fibrillation: Secondary | ICD-10-CM

## 2023-01-15 DIAGNOSIS — D6869 Other thrombophilia: Secondary | ICD-10-CM

## 2023-01-15 MED ORDER — POTASSIUM CHLORIDE CRYS ER 20 MEQ PO TBCR: 40.0000 meq | EXTENDED_RELEASE_TABLET | ORAL | 1 refills | Status: DC

## 2023-01-15 NOTE — Patient Instructions (Signed)
Medication Instructions:   DECREASE YOUR POTASSIUM CHLORIDE TO TAKING 40 mEq BY MOUTH THREE TIMES WEEKLY--TAKE ON MONDAYS, WEDNESDAYS, AND FRIDAYS WITH DEMADEX DOSING  *If you need a refill on your cardiac medications before your next appointment, please call your pharmacy*   You have been referred to [place]     Lab Work:  [place] OFFICE--BMET AND LIPIDS--PLEASE COME FASTING TO THIS LAB APPOINTMENT  If you have labs (blood work) drawn today and your tests are completely normal, you will receive your results only by: [place] (if you have MyChart) OR A paper copy in the mail If you have any lab test that is abnormal or we need to change your treatment, we will call you to review the results.     Follow-Up: At [facility], you and your health needs are our priority.  As part of our continuing mission to provide you with exceptional heart care, we have created designated Provider Care Teams.  These Care Teams include your primary Cardiologist (physician) and Advanced Practice Providers (APPs -  Physician Assistants and Nurse Practitioners) who all work together to provide you with the care you need, when you need it.  We recommend signing up for the patient portal called "MyChart".  Sign up information is provided on this After Visit Summary.  MyChart is used to connect with patients for Virtual Visits (Telemedicine).  Patients are able to view lab/test results, encounter notes, upcoming appointments, etc.  Non-urgent messages can be sent to your provider as well.   To learn more about what you can do with MyChart, go to [email or web address].    Your next appointment:   6 month(s)  Provider:   Freada Bergeron, MD

## 2023-01-23 ENCOUNTER — Ambulatory Visit: Payer: No Typology Code available for payment source | Attending: Cardiology

## 2023-01-23 DIAGNOSIS — I4819 Other persistent atrial fibrillation: Secondary | ICD-10-CM

## 2023-01-23 DIAGNOSIS — I10 Essential (primary) hypertension: Secondary | ICD-10-CM

## 2023-01-23 DIAGNOSIS — I5032 Chronic diastolic (congestive) heart failure: Secondary | ICD-10-CM

## 2023-01-23 DIAGNOSIS — I2510 Atherosclerotic heart disease of native coronary artery without angina pectoris: Secondary | ICD-10-CM

## 2023-01-23 DIAGNOSIS — Q264 Anomalous pulmonary venous connection, unspecified: Secondary | ICD-10-CM

## 2023-01-23 DIAGNOSIS — D6869 Other thrombophilia: Secondary | ICD-10-CM

## 2023-01-24 LAB — BASIC METABOLIC PANEL
BUN/Creatinine Ratio: 24 — ABNORMAL HIGH (ref 9–20)
BUN: 27 mg/dL — ABNORMAL HIGH (ref 6–24)
CO2: 23 mmol/L (ref 20–29)
Calcium: 9.7 mg/dL (ref 8.7–10.2)
Chloride: 102 mmol/L (ref 96–106)
Creatinine, Ser: 1.11 mg/dL (ref 0.76–1.27)
Glucose: 78 mg/dL (ref 70–99)
Potassium: 4.4 mmol/L (ref 3.5–5.2)
Sodium: 139 mmol/L (ref 134–144)
eGFR: 78 mL/min/{1.73_m2} (ref >59)

## 2023-01-24 LAB — LIPID PANEL
Chol/HDL Ratio: 3.4 ratio (ref 0.0–5.0)
Cholesterol, Total: 158 mg/dL (ref 100–199)
HDL: 46 mg/dL (ref >39)
LDL Chol Calc (NIH): 94 mg/dL (ref 0–99)
Triglycerides: 99 mg/dL (ref 0–149)
VLDL Cholesterol Cal: 18 mg/dL (ref 5–40)

## 2023-01-27 ENCOUNTER — Other Ambulatory Visit: Payer: Self-pay

## 2023-01-27 DIAGNOSIS — Z79899 Other long term (current) drug therapy: Secondary | ICD-10-CM

## 2023-03-11 ENCOUNTER — Encounter: Payer: Self-pay | Admitting: Cardiology

## 2023-03-11 DIAGNOSIS — I480 Paroxysmal atrial fibrillation: Secondary | ICD-10-CM

## 2023-03-11 DIAGNOSIS — Z79899 Other long term (current) drug therapy: Secondary | ICD-10-CM

## 2023-03-11 DIAGNOSIS — D6869 Other thrombophilia: Secondary | ICD-10-CM

## 2023-03-11 DIAGNOSIS — I5033 Acute on chronic diastolic (congestive) heart failure: Secondary | ICD-10-CM

## 2023-03-11 DIAGNOSIS — I10 Essential (primary) hypertension: Secondary | ICD-10-CM

## 2023-03-11 DIAGNOSIS — Q264 Anomalous pulmonary venous connection, unspecified: Secondary | ICD-10-CM

## 2023-03-11 DIAGNOSIS — I2510 Atherosclerotic heart disease of native coronary artery without angina pectoris: Secondary | ICD-10-CM

## 2023-03-11 MED ORDER — ROSUVASTATIN CALCIUM 10 MG PO TABS: 10.0000 mg | ORAL_TABLET | Freq: Every day | ORAL | 1 refills | Status: DC

## 2023-03-11 MED ORDER — FLECAINIDE ACETATE 50 MG PO TABS: 75.0000 mg | ORAL_TABLET | Freq: Two times a day (BID) | ORAL | 2 refills | Status: DC

## 2023-03-11 NOTE — Telephone Encounter (Signed)
RE: Medication refills Received: Today Christopher Sprague, MD  [name], LPN We can try crestor 10mg  daily and see how he does.   Pt and wife made aware via mychart to stop taking atorvastatin (updated in allergies as an intolerance) and switch to taking crestor 10 mg po daily and see how he does with that.   Sent crestor 10 mg to his pharmacy [facility].  Both the pt and wife agreed to this plan.

## 2023-03-26 ENCOUNTER — Telehealth: Payer: Self-pay | Admitting: Diagnostic Neuroimaging

## 2023-03-26 ENCOUNTER — Encounter: Payer: Self-pay | Admitting: Diagnostic Neuroimaging

## 2023-03-26 ENCOUNTER — Ambulatory Visit: Payer: No Typology Code available for payment source | Admitting: Diagnostic Neuroimaging

## 2023-03-26 VITALS — BP 119/80 | HR 93 | Ht 66.0 in | Wt 202.0 lb

## 2023-03-26 DIAGNOSIS — R4689 Other symptoms and signs involving appearance and behavior: Secondary | ICD-10-CM

## 2023-03-26 NOTE — Progress Notes (Signed)
[facility]  PATIENT: Christopher [name] DOB: 1966-06-08  REFERRING CLINICIAN: [name], MD HISTORY FROM: patient  REASON FOR VISIT: new consult   HISTORICAL  CHIEF COMPLAINT:  Chief Complaint  Patient presents with   New Patient (Initial Visit)    Rm 7, here with wife [name] Head and arms shaking, staring at objects without realizing its doing it. Intermittent expressive aphasia.This issues started after open Heart surgery 04/09/2022.     HISTORY OF PRESENT ILLNESS:   57 year old male here for evaluation of abnormal spells.  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  About 1 week postoperatively patient's wife noted some abnormal behaviors.  Sometimes he would talk to himself, [place] his left hand, have trouble getting his words out.  She also noted some intermittent tremor in the head and left hand.  Symptoms are intermittent and fairly stable over time.  They have slightly improved lately.  Due to persistence of symptoms and discussing with their cardiologist and PCP, patient was referred here for further evaluation.  Otherwise patient is doing well.  He has returned to work.  He is able to maintain all of his ADLs.  Tolerating medications.   REVIEW OF SYSTEMS: Full 14 system review of systems performed and negative with exception of: as per HPI.  ALLERGIES: Allergies  Allergen Reactions   Atorvastatin Other (See Comments)    Pt reports causes leg and feet cramps    HOME MEDICATIONS: Outpatient Medications Prior to Visit  Medication Sig Dispense Refill   aspirin EC 81 MG tablet Take 81 mg by mouth daily. Swallow whole.     Coenzyme Q10 (COQ-10 PO) Take 1 capsule by mouth daily. Qunol     flecainide (TAMBOCOR) 50 MG tablet Take 1.5 tablets (75 mg total) by mouth 2 (two) times daily. 270 tablet 2   potassium chloride SA (KLOR-CON M) 20 MEQ tablet Take 2 tablets (40 mEq total) by mouth 3  (three) times a week. Take on Mon, Wed, and Fri with Demadex dosing. 90 tablet 1   rosuvastatin (CRESTOR) 10 MG tablet Take 1 tablet (10 mg total) by mouth daily. 90 tablet 1   spironolactone (ALDACTONE) 25 MG tablet Take 1 tablet (25 mg total) by mouth daily. 90 tablet 3   torsemide (DEMADEX) 20 MG tablet Take one (1) tablet by mouth (20 mg ) every other day or take one half (0.5) tablet by mouth (10 mg) daily. If swelling increases take one tablet daily. (Patient taking differently: Take 20 mg by mouth. Take one (1) tablet by mouth (20 mg ) every other day or take one half (0.5) tablet by mouth (10 mg) daily. If swelling increases take one tablet daily.) 90 tablet 1   No facility-administered medications prior to visit.    PAST MEDICAL HISTORY: Past Medical History:  Diagnosis Date   A-fib (HCC)    on Eliquis, flecainide and metoprolol   Diverticulitis    Hypercholesteremia    Hypertension    Low back pain    recurrent   OSA (obstructive sleep apnea)    Mild    PAST SURGICAL HISTORY: Past Surgical History:  Procedure Laterality Date   ATRIAL FIBRILLATION ABLATION N/A 09/24/2021   Procedure: ATRIAL FIBRILLATION ABLATION;  Surgeon: [name], MD;  Location: [facility];  Service: Cardiovascular;  Laterality: N/A;   BICEPT TENODESIS Right 08/22/2021   Procedure: OPEN BICEPS TENODESIS;  Surgeon: [name], MD;  Location: [facility];  Service: Orthopedics;  Laterality: Right;   CARDIAC SURGERY     open heart surgery 04/09/2022   CARDIOVERSION N/A 05/17/2021   Procedure: CARDIOVERSION;  Surgeon: [name], MD;  Location: [facility] ENDOSCOPY;  Service: Cardiovascular;  Laterality: N/A;   LAPAROSCOPIC SIGMOID COLECTOMY     SHOULDER ARTHROSCOPY WITH SUBACROMIAL DECOMPRESSION Right 08/22/2021   Procedure: SHOULDER ARTHROSCOPY WITH SUBACROMIAL DECOMPRESSION;  Surgeon: [name], MD;  Location: [facility];  Service: Orthopedics;  Laterality:  Right;    FAMILY HISTORY: Family History  Problem Relation Age of Onset   Dementia Father 9   Heart attack Neg Hx     SOCIAL HISTORY: Social History   Socioeconomic History   Marital status: Married    Spouse name: Not on file   Number of children: Not on file   Years of education: Not on file   Highest education level: Not on file  Occupational History   Not on file  Tobacco Use   Smoking status: Every Day    Packs/day: .25    Types: Cigarettes   Smokeless tobacco: Never   Tobacco comments:    Smokes 3/4-1ppd  Substance and Sexual Activity   Alcohol use: Yes    Alcohol/week: 1.0 - 2.0 standard drink of alcohol    Types: 1 - 2 Glasses of wine per week    Comment: SOCIAL   Drug use: No   Sexual activity: Yes  Other Topics Concern   Not on file  Social History Narrative   Not on file   Social Determinants of Health   Financial Resource Strain: Not on file  Food Insecurity: Not on file  Transportation Needs: Not on file  Physical Activity: Not on file  Stress: Not on file  Social Connections: Not on file  Intimate Partner Violence: Not on file     PHYSICAL EXAM  GENERAL EXAM/CONSTITUTIONAL: Vitals:  Vitals:   03/26/23 1618  BP: 119/80  Pulse: 93  Weight: 202 lb (91.6 kg)  Height: 5\' 6"  (1.676 m)   Body mass index is 32.6 kg/m. Wt Readings from Last 3 Encounters:  03/26/23 202 lb (91.6 kg)  01/15/23 203 lb (92.1 kg)  09/11/22 207 lb (93.9 kg)   Patient is in no distress; well developed, nourished and groomed; neck is supple  CARDIOVASCULAR: Examination of carotid arteries is normal; no carotid bruits Regular rate and rhythm, no murmurs Examination of peripheral vascular system by observation and palpation is normal  EYES: Ophthalmoscopic exam of optic discs and posterior segments is normal; no papilledema or hemorrhages No results found.  MUSCULOSKELETAL: Gait, strength, tone, movements noted in Neurologic exam  below  NEUROLOGIC: MENTAL STATUS:      No data to display         awake, alert, oriented to person, place and time recent and remote memory intact normal attention and concentration language fluent, comprehension intact, naming intact fund of knowledge appropriate  CRANIAL NERVE:  2nd - no papilledema on fundoscopic exam 2nd, 3rd, 4th, 6th - pupils equal and reactive to light, visual fields full to confrontation, extraocular muscles intact, no nystagmus 5th - facial sensation symmetric 7th - facial strength symmetric 8th - hearing intact 9th - palate elevates symmetrically, uvula midline 11th - shoulder shrug symmetric 12th - tongue protrusion midline  MOTOR:  normal bulk and tone, full strength in the BUE, BLE  SENSORY:  normal and symmetric to light touch, temperature, vibration NO EXTINCTION; NO ANOSOGNOSIA OR LEFT  NEGLECT NOTED  COORDINATION:  finger-nose-finger, fine finger movements normal  REFLEXES:  deep tendon reflexes TRACE and symmetric  GAIT/STATION:  narrow based gait     DIAGNOSTIC DATA (LABS, IMAGING, TESTING) - I reviewed patient records, labs, notes, testing and imaging myself where available.  Lab Results  Component Value Date   WBC 7.9 09/03/2021   HGB 16.4 09/03/2021   HCT 46.7 09/03/2021   MCV 92 09/03/2021   PLT 243 09/03/2021      Component Value Date/Time   NA 139 01/23/2023 1130   K 4.4 01/23/2023 1130   CL 102 01/23/2023 1130   CO2 23 01/23/2023 1130   GLUCOSE 78 01/23/2023 1130   GLUCOSE 102 (H) 10/29/2021 1107   BUN 27 (H) 01/23/2023 1130   CREATININE 1.11 01/23/2023 1130   CREATININE 1.01 10/15/2016 1142   CALCIUM 9.7 01/23/2023 1130   PROT 6.6 07/26/2009 1315   ALBUMIN 3.8 07/26/2009 1315   AST 27 07/26/2009 1315   ALT 40 07/26/2009 1315   ALKPHOS 71 07/26/2009 1315   BILITOT 0.5 07/26/2009 1315   GFRNONAA >60 10/29/2021 1107   GFRAA  08/07/2009 0425    >60        The eGFR has been calculated using the MDRD  equation. This calculation has not been validated in all clinical situations. eGFR's persistently <60 mL/min signify possible Chronic Kidney Disease.   Lab Results  Component Value Date   CHOL 158 01/23/2023   HDL 46 01/23/2023   LDLCALC 94 01/23/2023   TRIG 99 01/23/2023   CHOLHDL 3.4 01/23/2023   No results found for: "HGBA1C" No results found for: "VITAMINB12" Lab Results  Component Value Date   TSH 1.38 10/15/2016     ASSESSMENT AND PLAN  57 y.o. year old male here with:  Dx:  1. Spell of abnormal behavior    PLAN:  INTERMITTENT STARING, CONFUSION, APHASIA (post-cardiac surgery in June 2023) - check MRI brain w/wo (rule out stroke, mass, inflamm) - continue med mgmt for stroke prevention (statin, BP control, aspirin); may need to consider to restart anti-coagulation for afib due to episodes that may be concerning for TIA - continue supportive care  Orders Placed This Encounter  Procedures   MR BRAIN W WO CONTRAST   Return for pending test results, pending if symptoms worsen or fail to improve.    Suanne Marker, MD 03/26/2023, 4:51 PM Certified in Neurology, Neurophysiology and Neuroimaging  [facility] [address] [place], [place] [address] [phone]

## 2023-03-26 NOTE — Telephone Encounter (Signed)
[employer] sent to [facility] they obtain auth [id number] 

## 2023-04-01 ENCOUNTER — Ambulatory Visit: Payer: No Typology Code available for payment source | Attending: Cardiology

## 2023-04-01 DIAGNOSIS — Z79899 Other long term (current) drug therapy: Secondary | ICD-10-CM

## 2023-04-02 ENCOUNTER — Telehealth: Payer: Self-pay | Admitting: Cardiology

## 2023-04-02 DIAGNOSIS — Z0189 Encounter for other specified special examinations: Secondary | ICD-10-CM

## 2023-04-02 DIAGNOSIS — I2510 Atherosclerotic heart disease of native coronary artery without angina pectoris: Secondary | ICD-10-CM

## 2023-04-02 DIAGNOSIS — Z79899 Other long term (current) drug therapy: Secondary | ICD-10-CM

## 2023-04-02 DIAGNOSIS — E782 Mixed hyperlipidemia: Secondary | ICD-10-CM

## 2023-04-02 DIAGNOSIS — E7800 Pure hypercholesterolemia, unspecified: Secondary | ICD-10-CM

## 2023-04-02 LAB — LIPID PANEL
Chol/HDL Ratio: 4.1 ratio (ref 0.0–5.0)
Cholesterol, Total: 197 mg/dL (ref 100–199)
HDL: 48 mg/dL (ref >39)
LDL Chol Calc (NIH): 110 mg/dL — ABNORMAL HIGH (ref 0–99)
Triglycerides: 228 mg/dL — ABNORMAL HIGH (ref 0–149)
VLDL Cholesterol Cal: 39 mg/dL (ref 5–40)

## 2023-04-02 MED ORDER — ROSUVASTATIN CALCIUM 20 MG PO TABS
20.00 mg | ORAL_TABLET | Freq: Every day | ORAL | 2 refills | Status: DC
Start: 2023-04-02 — End: 2023-05-05

## 2023-04-02 NOTE — Telephone Encounter (Signed)
Wife returned RN's call regarding results. 

## 2023-04-02 NOTE — Telephone Encounter (Signed)
The patients wife [name] (on [place])  has been notified of the result and verbalized understanding.  All questions (if any) were answered.  [name] is aware that Dr. Shari Prows wants to increase his crestor to 20 mg po daily and have him come back in for repeat lipids in 8 weeks, to reassess.   Confirmed the pharmacy of choice with the pts wife.  Wife states they will use their supply on hand first then call the pharmacy for refill thereafter.  Note placed to the pharmacy about that.  Scheduled the pt for repeat lipids in 8 weeks for 06/10/23.  Wife is aware to have the pt come fasting to this lab appt.   Wife verbalized understanding and agrees with this plan.

## 2023-04-02 NOTE — Telephone Encounter (Signed)
[name], MD  [name], LPN LDL is above goal at 110 (goal <70). Can we increase his crestor to 20mg  daily and repeat lipids in 8 weeks to see if he is at goal.   Left the wife Efraim Kaufmann a message to call the office back to endorse the pts lab results and recommendations per Dr. Shari Prows.

## 2023-04-24 ENCOUNTER — Encounter: Payer: Self-pay | Admitting: Cardiology

## 2023-04-29 ENCOUNTER — Encounter: Payer: Self-pay | Admitting: Diagnostic Neuroimaging

## 2023-05-02 ENCOUNTER — Ambulatory Visit
Admission: RE | Admit: 2023-05-02 | Discharge: 2023-05-02 | Disposition: A | Discharge status: Home / Self Care | Payer: No Typology Code available for payment source | Source: Ambulatory Visit | Attending: Diagnostic Neuroimaging | Admitting: Diagnostic Neuroimaging

## 2023-05-02 DIAGNOSIS — R4689 Other symptoms and signs involving appearance and behavior: Secondary | ICD-10-CM

## 2023-05-02 DIAGNOSIS — R4182 Altered mental status, unspecified: Secondary | ICD-10-CM | POA: Diagnosis not present

## 2023-05-02 MED ORDER — GADOPICLENOL 0.5 MMOL/ML IV SOLN
10.0000 mL | Freq: Once | INTRAVENOUS | Status: AC | PRN
  Administered 2023-05-02: 10 mL via INTRAVENOUS

## 2023-05-04 ENCOUNTER — Telehealth: Payer: Self-pay

## 2023-05-04 ENCOUNTER — Encounter: Payer: Self-pay | Admitting: Diagnostic Neuroimaging

## 2023-05-04 ENCOUNTER — Encounter: Payer: Self-pay | Admitting: Cardiology

## 2023-05-04 DIAGNOSIS — Z79899 Other long term (current) drug therapy: Secondary | ICD-10-CM

## 2023-05-04 DIAGNOSIS — E7800 Pure hypercholesterolemia, unspecified: Secondary | ICD-10-CM

## 2023-05-04 DIAGNOSIS — Z0189 Encounter for other specified special examinations: Secondary | ICD-10-CM

## 2023-05-04 DIAGNOSIS — E782 Mixed hyperlipidemia: Secondary | ICD-10-CM

## 2023-05-04 DIAGNOSIS — I2510 Atherosclerotic heart disease of native coronary artery without angina pectoris: Secondary | ICD-10-CM

## 2023-05-04 NOTE — Telephone Encounter (Signed)
-----   Message from Suanne Marker, MD sent at 05/04/2023  9:19 AM EDT ----- Unremarkable imaging results. Please call patient. Continue current plan. -VRP

## 2023-05-04 NOTE — Telephone Encounter (Signed)
I left a voicemail detailing normal results (as per DPR), along with the message to continue on the current treatment plan. The office number was provided for a return call, if needed.

## 2023-05-05 MED ORDER — ROSUVASTATIN CALCIUM 20 MG PO TABS
20.0000 mg | ORAL_TABLET | Freq: Every day | ORAL | 2 refills | Status: DC
Start: 2023-05-05 — End: 2023-11-05

## 2023-06-10 ENCOUNTER — Ambulatory Visit: Payer: No Typology Code available for payment source

## 2023-06-12 ENCOUNTER — Other Ambulatory Visit: Payer: Self-pay

## 2023-06-12 DIAGNOSIS — I2510 Atherosclerotic heart disease of native coronary artery without angina pectoris: Secondary | ICD-10-CM

## 2023-06-12 DIAGNOSIS — Q264 Anomalous pulmonary venous connection, unspecified: Secondary | ICD-10-CM

## 2023-06-12 DIAGNOSIS — R4789 Other speech disturbances: Secondary | ICD-10-CM

## 2023-06-12 DIAGNOSIS — I10 Essential (primary) hypertension: Secondary | ICD-10-CM

## 2023-06-12 DIAGNOSIS — I4819 Other persistent atrial fibrillation: Secondary | ICD-10-CM

## 2023-06-12 DIAGNOSIS — D6869 Other thrombophilia: Secondary | ICD-10-CM

## 2023-06-12 DIAGNOSIS — I5032 Chronic diastolic (congestive) heart failure: Secondary | ICD-10-CM

## 2023-06-12 MED ORDER — POTASSIUM CHLORIDE CRYS ER 20 MEQ PO TBCR
40.0000 meq | EXTENDED_RELEASE_TABLET | ORAL | 1 refills | Status: DC
Start: 2023-06-12 — End: 2024-01-08

## 2023-06-22 ENCOUNTER — Ambulatory Visit: Payer: No Typology Code available for payment source | Attending: Cardiology

## 2023-06-22 DIAGNOSIS — E7800 Pure hypercholesterolemia, unspecified: Secondary | ICD-10-CM

## 2023-06-22 DIAGNOSIS — E782 Mixed hyperlipidemia: Secondary | ICD-10-CM

## 2023-06-22 DIAGNOSIS — Z79899 Other long term (current) drug therapy: Secondary | ICD-10-CM

## 2023-06-22 DIAGNOSIS — Z0189 Encounter for other specified special examinations: Secondary | ICD-10-CM

## 2023-06-22 DIAGNOSIS — I2510 Atherosclerotic heart disease of native coronary artery without angina pectoris: Secondary | ICD-10-CM

## 2023-06-22 LAB — LIPID PANEL
Chol/HDL Ratio: 3.5 ratio (ref 0.0–5.0)
Cholesterol, Total: 151 mg/dL (ref 100–199)
HDL: 43 mg/dL (ref >39)
LDL Chol Calc (NIH): 76 mg/dL (ref 0–99)
Triglycerides: 193 mg/dL — ABNORMAL HIGH (ref 0–149)
VLDL Cholesterol Cal: 32 mg/dL (ref 5–40)

## 2023-08-31 ENCOUNTER — Ambulatory Visit: Payer: No Typology Code available for payment source | Attending: Internal Medicine | Admitting: Internal Medicine

## 2023-08-31 VITALS — BP 100/76 | HR 93 | Ht 66.0 in | Wt 206.0 lb

## 2023-08-31 DIAGNOSIS — E782 Mixed hyperlipidemia: Secondary | ICD-10-CM | POA: Diagnosis not present

## 2023-08-31 DIAGNOSIS — D6869 Other thrombophilia: Secondary | ICD-10-CM | POA: Diagnosis not present

## 2023-08-31 DIAGNOSIS — I4891 Unspecified atrial fibrillation: Secondary | ICD-10-CM | POA: Diagnosis not present

## 2023-08-31 DIAGNOSIS — I10 Essential (primary) hypertension: Secondary | ICD-10-CM | POA: Diagnosis not present

## 2023-08-31 DIAGNOSIS — Z79899 Other long term (current) drug therapy: Secondary | ICD-10-CM

## 2023-08-31 DIAGNOSIS — E7800 Pure hypercholesterolemia, unspecified: Secondary | ICD-10-CM

## 2023-08-31 NOTE — Patient Instructions (Signed)
Medication Instructions:  Your physician recommends that you continue on your current medications as directed. Please refer to the Current Medication list given to you today.  *If you need a refill on your cardiac medications before your next appointment, please call your pharmacy*  Lab Work: Lipid Panel (fasting) right before seeing Dr. Jacques Navy in 6 months. If you have labs (blood work) drawn today and your tests are completely normal, you will receive your results only by: MyChart Message (if you have MyChart) OR A paper copy in the mail If you have any lab test that is abnormal or we need to change your treatment, we will call you to review the results.  Testing/Procedures: None  Follow-Up: At [facility], you and your health needs are our priority.  As part of our continuing mission to provide you with exceptional heart care, we have created designated Provider Care Teams.  These Care Teams include your primary Cardiologist (physician) and Advanced Practice Providers (APPs -  Physician Assistants and Nurse Practitioners) who all work together to provide you with the care you need, when you need it.   Your next appointment:   6 month(s)  Provider:   Dr. Weston Brass

## 2023-08-31 NOTE — Progress Notes (Signed)
Cardiology Office Note:  .   Date:  08/31/2023  ID:  Christopher [name], DOB Feb 20, 1966, MRN [id number] PCP: Ollen Bowl, MD  [facility] Providers Cardiologist:  [name], MD (Inactive)    History of Present Illness: .   Christopher [name] is a 57 y.o. male.  Discussed the use of AI scribe software for clinical note transcription with the patient, who gave verbal consent to proceed.  History of Present Illness   The patient, with a history of cardiac surgery for PAPVR and pulmonary vein ablation, presents for follow-up. He reports occasional chest discomfort radiating to both arms, primarily the left, which has been occurring twice daily. This discomfort is described as a brief pain followed by a sensation of numbness. The patient notes that this symptom was present before his cardiac surgery and had subsided for a period post-operatively, but has since returned. The patient denies any unusual discomfort or complications related to the surgical incision healing.  The patient also reports a history of significant post-operative swelling, particularly in the feet, which was so severe that he could not feel his toes when placing his feet on the floor. The swelling has since improved, attributed to the administration of torsemide and spironolactone. The patient continues to take torsemide three times a week and spironolactone daily. He also reports a persistent dry cough.  The patient has a history of elevated cholesterol and triglycerides, which have shown some improvement on Crestor 20mg . He reports making dietary changes, including reducing snack intake and salt consumption. The patient also mentions a family history of diabetes and recent blood work indicating a hemoglobin A1c of 6.5. He denies any symptoms of hyperglycemia such as fatigue or frequent urination.  The patient's spouse reports that the patient's energy levels have improved significantly since the cardiac surgery.  The patient also reports a decrease in fatigue and increased participation in family functions. He attributes this improvement to the resolution of blood flow issues following his cardiac surgery.          ROS: negative except per HPI above.  Studies Reviewed: Marland Kitchen   EKG Interpretation Date/Time:  Monday August 31 2023 15:56:49 EST Ventricular Rate:  93 PR Interval:  192 QRS Duration:  116 QT Interval:  366 QTC Calculation: 455 R Axis:   -1  Text Interpretation: Normal sinus rhythm Minimal voltage criteria for LVH, may be normal variant ( Cornell product ) Inferior infarct (cited on or before 20-Jun-2021) When compared with ECG of 22-Oct-2021 08:46, No significant change was found Confirmed by Weston Brass ([id number]) on 08/31/2023 4:39:05 PM    Results   LABS LDL: 110 (03/2023) LDL: 76 (05/2023) Triglycerides: 228 (03/2023) Triglycerides: 193 (05/2023) HbA1c: 6.5% (08/26/2023) Fasting glucose: 80 mg/dL (96/29/5284)  RADIOLOGY Chest x-ray: Normal  DIAGNOSTIC Heart catheterization: Mild cad (12/2021) Echocardiogram: Normal     Risk Assessment/Calculations:    CHA2DS2-VASc Score = 1   This indicates a 0.6% annual risk of stroke. The patient's score is based upon: CHF History: 0 HTN History: 0 Diabetes History: 0 Stroke History: 0 Vascular Disease History: 1 Age Score: 0 Gender Score: 0         Physical Exam:   VS:  BP 100/76 (BP Location: Left Arm, Patient Position: Sitting, Cuff Size: Large)   Pulse 93   Ht 5\' 6"  (1.676 m)   Wt 206 lb (93.4 kg)   SpO2 96%   BMI 33.25 kg/m    Wt Readings from Last 3 Encounters:  08/31/23  206 lb (93.4 kg)  03/26/23 202 lb (91.6 kg)  01/15/23 203 lb (92.1 kg)     Physical Exam   CHEST: Lungs clear to auscultation. SKIN: Incision site on chest healed well, no discomfort.     GEN: Well nourished, well developed in no acute distress NECK: No JVD; No carotid bruits CARDIAC: RRR, no murmurs, rubs, gallops RESPIRATORY:   Clear to auscultation without rales, wheezing or rhonchi  ABDOMEN: Soft, non-tender, non-distended EXTREMITIES:  No edema; No deformity   ASSESSMENT AND PLAN: .    1. Essential hypertension   2. Atrial fibrillation, unspecified type (HCC)   3. Mixed hyperlipidemia   4. Secondary hypercoagulable state (HCC)   5. High risk medication use     Assessment and Plan    Post-operative status post pulmonary vein surgery Occasional chest discomfort radiating to arms, more so on the left, lasting for a short duration. No significant findings on EKG, echo, or labs. Likely related to post-operative changes. -Continue monitoring symptoms.  Hyperlipidemia LDL improved to 76 with Crestor 20mg  daily. Triglycerides slightly elevated at 193, down from 228. -Continue Crestor 20mg  daily. -Check lipid panel in April 2025.  Lower extremity edema Mild persistent swelling, likely due to varicose veins and post-operative changes. Managed with Torsemide and Spironolactone. -Continue Torsemide three times a week and Spironolactone daily. -Consider use of compression socks if swelling remains bothersome.  Follow-up in six months (May 2025) with cholesterol check in April 2025.     Afib - continues on flecanide per Afib clinic.  - laa clipping at time of surgery, no longer on [facility] - no longer on BB due to hypotension.           Total time of encounter: 40 minutes total time of encounter, including 30 minutes spent in face-to-face patient care on the date of this encounter. This time includes coordination of care and counseling regarding above mentioned problem list. Remainder of non-face-to-face time involved reviewing chart documents/testing relevant to the patient encounter and documentation in the medical record. I have independently reviewed documentation from referring provider.   Weston Brass, MD, [facility]  [facility]

## 2023-09-15 ENCOUNTER — Other Ambulatory Visit: Payer: Self-pay

## 2023-09-15 DIAGNOSIS — I10 Essential (primary) hypertension: Secondary | ICD-10-CM

## 2023-09-15 MED ORDER — SPIRONOLACTONE 25 MG PO TABS: 25.0000 mg | ORAL_TABLET | Freq: Every day | ORAL | 3 refills | Status: DC

## 2023-11-05 ENCOUNTER — Other Ambulatory Visit: Payer: Self-pay

## 2023-11-05 DIAGNOSIS — Z0189 Encounter for other specified special examinations: Secondary | ICD-10-CM

## 2023-11-05 DIAGNOSIS — Z79899 Other long term (current) drug therapy: Secondary | ICD-10-CM

## 2023-11-05 DIAGNOSIS — I2510 Atherosclerotic heart disease of native coronary artery without angina pectoris: Secondary | ICD-10-CM

## 2023-11-05 DIAGNOSIS — E782 Mixed hyperlipidemia: Secondary | ICD-10-CM

## 2023-11-05 DIAGNOSIS — E7800 Pure hypercholesterolemia, unspecified: Secondary | ICD-10-CM

## 2023-11-05 MED ORDER — ROSUVASTATIN CALCIUM 20 MG PO TABS: 20.0000 mg | ORAL_TABLET | Freq: Every day | ORAL | 3 refills | Status: AC

## 2023-12-02 ENCOUNTER — Other Ambulatory Visit: Payer: Self-pay

## 2023-12-02 DIAGNOSIS — Q264 Anomalous pulmonary venous connection, unspecified: Secondary | ICD-10-CM

## 2023-12-02 DIAGNOSIS — I10 Essential (primary) hypertension: Secondary | ICD-10-CM

## 2023-12-02 DIAGNOSIS — Z79899 Other long term (current) drug therapy: Secondary | ICD-10-CM

## 2023-12-02 DIAGNOSIS — I5033 Acute on chronic diastolic (congestive) heart failure: Secondary | ICD-10-CM

## 2023-12-02 DIAGNOSIS — D6869 Other thrombophilia: Secondary | ICD-10-CM

## 2023-12-02 DIAGNOSIS — I480 Paroxysmal atrial fibrillation: Secondary | ICD-10-CM

## 2023-12-02 DIAGNOSIS — I2510 Atherosclerotic heart disease of native coronary artery without angina pectoris: Secondary | ICD-10-CM

## 2023-12-02 MED ORDER — FLECAINIDE ACETATE 50 MG PO TABS: 75.0000 mg | ORAL_TABLET | Freq: Two times a day (BID) | ORAL | 3 refills | Status: AC

## 2024-01-02 ENCOUNTER — Other Ambulatory Visit: Payer: Self-pay | Admitting: Internal Medicine

## 2024-01-02 DIAGNOSIS — D6869 Other thrombophilia: Secondary | ICD-10-CM

## 2024-01-02 DIAGNOSIS — I10 Essential (primary) hypertension: Secondary | ICD-10-CM

## 2024-01-02 DIAGNOSIS — R4789 Other speech disturbances: Secondary | ICD-10-CM

## 2024-01-02 DIAGNOSIS — I5032 Chronic diastolic (congestive) heart failure: Secondary | ICD-10-CM

## 2024-01-02 DIAGNOSIS — Q264 Anomalous pulmonary venous connection, unspecified: Secondary | ICD-10-CM

## 2024-01-02 DIAGNOSIS — I2510 Atherosclerotic heart disease of native coronary artery without angina pectoris: Secondary | ICD-10-CM

## 2024-01-02 DIAGNOSIS — I4819 Other persistent atrial fibrillation: Secondary | ICD-10-CM

## 2024-01-08 ENCOUNTER — Other Ambulatory Visit: Payer: Self-pay

## 2024-01-08 DIAGNOSIS — R4789 Other speech disturbances: Secondary | ICD-10-CM

## 2024-01-08 DIAGNOSIS — D6869 Other thrombophilia: Secondary | ICD-10-CM

## 2024-01-08 DIAGNOSIS — I2510 Atherosclerotic heart disease of native coronary artery without angina pectoris: Secondary | ICD-10-CM

## 2024-01-08 DIAGNOSIS — I4819 Other persistent atrial fibrillation: Secondary | ICD-10-CM

## 2024-01-08 DIAGNOSIS — Q264 Anomalous pulmonary venous connection, unspecified: Secondary | ICD-10-CM

## 2024-01-08 DIAGNOSIS — I5032 Chronic diastolic (congestive) heart failure: Secondary | ICD-10-CM

## 2024-01-08 DIAGNOSIS — I10 Essential (primary) hypertension: Secondary | ICD-10-CM

## 2024-01-08 MED ORDER — POTASSIUM CHLORIDE CRYS ER 20 MEQ PO TBCR: 40.0000 meq | EXTENDED_RELEASE_TABLET | ORAL | 2 refills | Status: DC

## 2024-05-26 ENCOUNTER — Encounter: Payer: Self-pay | Admitting: Internal Medicine

## 2024-05-26 ENCOUNTER — Ambulatory Visit: Attending: Internal Medicine | Admitting: Internal Medicine

## 2024-05-26 VITALS — BP 110/78 | HR 89 | Ht 66.0 in | Wt 209.4 lb

## 2024-05-26 DIAGNOSIS — I10 Essential (primary) hypertension: Secondary | ICD-10-CM

## 2024-05-26 DIAGNOSIS — Q264 Anomalous pulmonary venous connection, unspecified: Secondary | ICD-10-CM | POA: Diagnosis not present

## 2024-05-26 DIAGNOSIS — D6869 Other thrombophilia: Secondary | ICD-10-CM | POA: Diagnosis not present

## 2024-05-26 DIAGNOSIS — I5032 Chronic diastolic (congestive) heart failure: Secondary | ICD-10-CM

## 2024-05-26 DIAGNOSIS — Z8774 Personal history of (corrected) congenital malformations of heart and circulatory system: Secondary | ICD-10-CM

## 2024-05-26 DIAGNOSIS — R079 Chest pain, unspecified: Secondary | ICD-10-CM | POA: Diagnosis not present

## 2024-05-26 DIAGNOSIS — I2510 Atherosclerotic heart disease of native coronary artery without angina pectoris: Secondary | ICD-10-CM

## 2024-05-26 DIAGNOSIS — R609 Edema, unspecified: Secondary | ICD-10-CM

## 2024-05-26 DIAGNOSIS — I4891 Unspecified atrial fibrillation: Secondary | ICD-10-CM

## 2024-05-26 DIAGNOSIS — E785 Hyperlipidemia, unspecified: Secondary | ICD-10-CM | POA: Diagnosis not present

## 2024-05-26 DIAGNOSIS — Z79899 Other long term (current) drug therapy: Secondary | ICD-10-CM

## 2024-05-26 DIAGNOSIS — F17210 Nicotine dependence, cigarettes, uncomplicated: Secondary | ICD-10-CM

## 2024-05-26 DIAGNOSIS — E782 Mixed hyperlipidemia: Secondary | ICD-10-CM

## 2024-05-26 DIAGNOSIS — R4789 Other speech disturbances: Secondary | ICD-10-CM

## 2024-05-26 DIAGNOSIS — I4819 Other persistent atrial fibrillation: Secondary | ICD-10-CM

## 2024-05-26 MED ORDER — EZETIMIBE 10 MG PO TABS: 10.0000 mg | ORAL_TABLET | Freq: Every day | ORAL | 3 refills | Status: AC

## 2024-05-26 MED ORDER — POTASSIUM CHLORIDE CRYS ER 20 MEQ PO TBCR: 40.0000 meq | EXTENDED_RELEASE_TABLET | ORAL | 2 refills | Status: AC

## 2024-05-26 NOTE — Progress Notes (Signed)
 Cardiology Office Note:  .   Date:  05/26/2024  ID:  Christopher [name], DOB 1966-02-18, MRN [id number] PCP: [name], MD  [facility] Providers Cardiologist:  Soyla DELENA Merck, MD    History of Present Illness: .   Christopher [name] is a 58 y.o. male.  Discussed the use of AI scribe software for clinical note transcription with the patient, who gave verbal consent to proceed.  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  He experiences an 'uncomfortable feeling' substernal chest at work with increased stress, relieved by rest, without associated palpitations or heart racing. He takes flecainide  for atrial fibrillation and Crestor  for hyperlipidemia, with improved cholesterol levels. Mild lower extremity swelling occurs when sitting for long periods, improving with leg elevation. He takes torsemide  and spironolactone , though swelling persists. Wheezing is present today, attributed to smoking less than half a pack of cigarettes daily. He finds quitting smoking challenging, especially at work.    ROS: negative except per HPI above.  Studies Reviewed: SABRA   EKG Interpretation Date/Time:  Thursday May 26 2024 16:31:24 EDT Ventricular Rate:  89 PR Interval:  192 QRS Duration:  104 QT Interval:  358 QTC Calculation: 435 R Axis:   261  Text Interpretation: Normal sinus rhythm Right superior axis deviation Inferior infarct (cited on or before 20-Jun-2021) When compared with ECG of 31-Aug-2023 15:56, No significant change was found Confirmed by Merck Soyla ([id number]) on 05/26/2024 4:59:22 PM    Results LABS Triglycerides: 166 mg/dL (94/7974) LDL: 81 mg/dL (94/7974)  RADIOLOGY CT scan: Calcium  score 5  DIAGNOSTIC EKG: Normal (05/26/2024) Echocardiogram: Mild right ventricular dilation and hypertrophy, normal right ventricular function (2023) Risk  Assessment/Calculations:    CHA2DS2-VASc Score =     This indicates a  % annual risk of stroke. The patient's score is based upon:        Physical Exam:   VS:  BP 110/78   Pulse 89   Ht 5' 6 (1.676 m)   Wt 209 lb 6.4 oz (95 kg)   SpO2 95%   BMI 33.80 kg/m    Wt Readings from Last 3 Encounters:  05/26/24 209 lb 6.4 oz (95 kg)  08/31/23 206 lb (93.4 kg)  03/26/23 202 lb (91.6 kg)     Physical Exam GENERAL: Alert, cooperative, well developed, no acute distress HEENT: Normocephalic, normal oropharynx, moist mucous membranes CHEST: Wheezing present CARDIOVASCULAR: Normal heart rate and rhythm, S1 and S2 normal without murmurs ABDOMEN: Soft, non-tender, non-distended, without organomegaly, Normal bowel sounds EXTREMITIES: No cyanosis or edema NEUROLOGICAL: Cranial nerves grossly intact, Moves all extremities without gross motor or sensory deficit   ASSESSMENT AND PLAN: .    Assessment and Plan Assessment & Plan Chest Discomfort Intermittent discomfort not linked to exertion, possibly stress-related. Previous EKG normal. No current cardiac concerns.  - Monitor and report increased frequency or rest occurrence. - Consider CT scan of coronary arteries if persistent. - Consider updated echocardiogram if persistent.  PAPVR s/p repair at [facility] -stable, no significant new symptoms. Will image with increase in symptoms or changes.  Atrial Fibrillation s/p ablation 08/2021 Managed with flecainide . No anticoagulation. No beta-blockers due to hypotension. Current EKG normal, no recent episodes. - Continue flecainide  75 mg BID   Hyperlipidemia Medication management Managed with rosuvastatin . Triglycerides and LDL slightly above target. Calcium  score of 5 indicates cardiovascular risk. Ezetimibe  added to  lower cholesterol without increasing statin dose. - Prescribe ezetimibe  10 mg daily. - Continue rosuvastatin  20 mg daily. - Monitor cholesterol levels and adjust treatment as  needed.  Lower Extremity Edema Mild edema likely due to venous insufficiency. Current diuretics ineffective. Compression and elevation recommended and help the most. - Recommend use of compression stockings. - Advise leg elevation to reduce swelling.  Smoking Cessation Smokes less than half a pack daily. Cessation crucial to prevent cardiovascular complications. - Encourage smoking cessation and suggest finding a quit buddy for support.  Chronic diastolic HF - continues on torsemide  20 mg every other day, and spironolactone  25 mg daily. He continues on potassium supplementation and requests a refill. We will check lab work, though overall has been stable.       Soyla Merck, MD, FACC

## 2024-05-26 NOTE — Patient Instructions (Addendum)
 Medication Instructions:  Start: Ezetimibe  (Zetia ) 10 mg, one tablet, once daily  Lab Work: FASTING Lipid Panel to be done in about 6 months (due end of Jan 2026), right before returning for your 6 month follow up visit. Water and/or black coffee only; you can go to any [facility] or back to [address]. (8:00 am-4:30 pm) sults.  Testing/Procedures: None  Follow-Up: At [facility], you and your health needs are our priority.  As part of our continuing mission to provide you with exceptional heart care, our providers are all part of one team.  This team includes your primary Cardiologist (physician) and Advanced Practice Providers or APPs (Physician Assistants and Nurse Practitioners) who all work together to provide you with the care you need, when you need it.  Your next appointment:   6 month(s)  Provider:   Soyla DELENA Merck, MD or available APP (Physician's Assistant or Nurse Practitioner)  Other Instructions Please call us  or send a MyChart message with any Cardiology related questions/concerns.  [phone].  Thank you!

## 2024-05-30 ENCOUNTER — Telehealth: Payer: Self-pay | Admitting: *Deleted

## 2024-05-30 DIAGNOSIS — I5032 Chronic diastolic (congestive) heart failure: Secondary | ICD-10-CM

## 2024-05-30 DIAGNOSIS — Z79899 Other long term (current) drug therapy: Secondary | ICD-10-CM

## 2024-05-30 DIAGNOSIS — I10 Essential (primary) hypertension: Secondary | ICD-10-CM

## 2024-05-30 DIAGNOSIS — I2510 Atherosclerotic heart disease of native coronary artery without angina pectoris: Secondary | ICD-10-CM

## 2024-05-30 NOTE — Telephone Encounter (Signed)
-----   Message from Soyla DELENA Merck sent at 05/28/2024  2:02 PM EDT ----- We refilled potassium but I did not realized he has not had recent lab work since he's on both potassium and spironolactone . Please get an updated CMET on him. Thanks! GA

## 2024-05-30 NOTE — Telephone Encounter (Signed)
 Called and spoke to wife, [name] (okay per DPR). He will plan on returning on 06/10/24 to have labs done (CMET). Order entered.

## 2024-06-10 LAB — COMPREHENSIVE METABOLIC PANEL WITH GFR
ALT: 23 IU/L (ref 0–44)
AST: 19 IU/L (ref 0–40)
Albumin: 4.1 g/dL (ref 3.8–4.9)
Alkaline Phosphatase: 102 IU/L (ref 44–121)
BUN/Creatinine Ratio: 22 — ABNORMAL HIGH (ref 9–20)
BUN: 23 mg/dL (ref 6–24)
Bilirubin Total: 0.4 mg/dL (ref 0.0–1.2)
CO2: 18 mmol/L — ABNORMAL LOW (ref 20–29)
Calcium: 8.9 mg/dL (ref 8.7–10.2)
Chloride: 106 mmol/L (ref 96–106)
Creatinine, Ser: 1.03 mg/dL (ref 0.76–1.27)
Globulin, Total: 2.5 g/dL (ref 1.5–4.5)
Glucose: 110 mg/dL — ABNORMAL HIGH (ref 70–99)
Potassium: 4.4 mmol/L (ref 3.5–5.2)
Sodium: 139 mmol/L (ref 134–144)
Total Protein: 6.6 g/dL (ref 6.0–8.5)
eGFR: 84 mL/min/1.73 (ref >59)

## 2024-06-11 ENCOUNTER — Ambulatory Visit: Payer: Self-pay | Admitting: Internal Medicine

## 2024-06-17 ENCOUNTER — Other Ambulatory Visit: Payer: Self-pay | Admitting: Internal Medicine

## 2024-06-17 DIAGNOSIS — I10 Essential (primary) hypertension: Secondary | ICD-10-CM

## 2024-09-05 ENCOUNTER — Other Ambulatory Visit: Payer: Self-pay | Admitting: Internal Medicine

## 2024-09-05 DIAGNOSIS — I2510 Atherosclerotic heart disease of native coronary artery without angina pectoris: Secondary | ICD-10-CM

## 2024-09-05 DIAGNOSIS — D6869 Other thrombophilia: Secondary | ICD-10-CM

## 2024-09-05 DIAGNOSIS — I5033 Acute on chronic diastolic (congestive) heart failure: Secondary | ICD-10-CM

## 2024-09-05 DIAGNOSIS — I10 Essential (primary) hypertension: Secondary | ICD-10-CM

## 2024-09-05 DIAGNOSIS — Z79899 Other long term (current) drug therapy: Secondary | ICD-10-CM

## 2024-09-05 DIAGNOSIS — I480 Paroxysmal atrial fibrillation: Secondary | ICD-10-CM

## 2024-09-05 DIAGNOSIS — Q264 Anomalous pulmonary venous connection, unspecified: Secondary | ICD-10-CM

## 2024-09-12 ENCOUNTER — Other Ambulatory Visit: Payer: Self-pay | Admitting: Internal Medicine

## 2024-09-12 DIAGNOSIS — I10 Essential (primary) hypertension: Secondary | ICD-10-CM

## 2024-09-15 ENCOUNTER — Other Ambulatory Visit: Payer: Self-pay

## 2024-09-15 ENCOUNTER — Encounter: Payer: Self-pay | Admitting: Internal Medicine

## 2024-09-15 DIAGNOSIS — I10 Essential (primary) hypertension: Secondary | ICD-10-CM

## 2024-09-15 MED ORDER — SPIRONOLACTONE 25 MG PO TABS: 25.0000 mg | ORAL_TABLET | Freq: Every day | ORAL | 2 refills | Status: AC

## 2024-09-28 LAB — LAB REPORT - SCANNED
A1c: 6.6
Creatinine, POC: 182 mg/dL
EGFR: 94
Microalb Creat Ratio: 32.1
Microalbumin, Urine: 5.82

## 2024-11-01 DIAGNOSIS — I2510 Atherosclerotic heart disease of native coronary artery without angina pectoris: Secondary | ICD-10-CM

## 2024-11-01 DIAGNOSIS — I10 Essential (primary) hypertension: Secondary | ICD-10-CM

## 2024-11-02 ENCOUNTER — Other Ambulatory Visit: Payer: Self-pay | Admitting: Internal Medicine

## 2024-11-02 DIAGNOSIS — E782 Mixed hyperlipidemia: Secondary | ICD-10-CM

## 2024-11-02 DIAGNOSIS — E7800 Pure hypercholesterolemia, unspecified: Secondary | ICD-10-CM

## 2024-11-02 DIAGNOSIS — Z79899 Other long term (current) drug therapy: Secondary | ICD-10-CM

## 2024-11-02 DIAGNOSIS — I2510 Atherosclerotic heart disease of native coronary artery without angina pectoris: Secondary | ICD-10-CM

## 2024-11-02 DIAGNOSIS — Z0189 Encounter for other specified special examinations: Secondary | ICD-10-CM

## 2024-11-09 ENCOUNTER — Encounter: Payer: Self-pay | Admitting: Internal Medicine

## 2024-11-22 ENCOUNTER — Ambulatory Visit: Payer: Self-pay | Admitting: Internal Medicine

## 2024-12-21 ENCOUNTER — Ambulatory Visit: Admitting: Internal Medicine
